# Patient Record
Sex: Female | Born: 1970 | Race: White | Hispanic: No | Marital: Married | Smoking: Never smoker
Health system: Southern US, Community
[De-identification: ages and names within clinical notes are randomized; demographics above are authoritative.]

## PROBLEM LIST (undated history)

## (undated) DIAGNOSIS — M1990 Unspecified osteoarthritis, unspecified site: Secondary | ICD-10-CM

## (undated) DIAGNOSIS — F329 Major depressive disorder, single episode, unspecified: Secondary | ICD-10-CM

## (undated) DIAGNOSIS — R519 Headache, unspecified: Secondary | ICD-10-CM

## (undated) DIAGNOSIS — K219 Gastro-esophageal reflux disease without esophagitis: Secondary | ICD-10-CM

## (undated) DIAGNOSIS — D649 Anemia, unspecified: Secondary | ICD-10-CM

## (undated) DIAGNOSIS — C801 Malignant (primary) neoplasm, unspecified: Secondary | ICD-10-CM

## (undated) DIAGNOSIS — T4145XA Adverse effect of unspecified anesthetic, initial encounter: Secondary | ICD-10-CM

## (undated) DIAGNOSIS — T8859XA Other complications of anesthesia, initial encounter: Secondary | ICD-10-CM

## (undated) DIAGNOSIS — F4310 Post-traumatic stress disorder, unspecified: Secondary | ICD-10-CM

## (undated) DIAGNOSIS — F419 Anxiety disorder, unspecified: Secondary | ICD-10-CM

## (undated) DIAGNOSIS — I10 Essential (primary) hypertension: Secondary | ICD-10-CM

## (undated) DIAGNOSIS — F32A Depression, unspecified: Secondary | ICD-10-CM

## (undated) DIAGNOSIS — E785 Hyperlipidemia, unspecified: Secondary | ICD-10-CM

## (undated) DIAGNOSIS — J189 Pneumonia, unspecified organism: Secondary | ICD-10-CM

## (undated) DIAGNOSIS — R748 Abnormal levels of other serum enzymes: Secondary | ICD-10-CM

## (undated) DIAGNOSIS — J45909 Unspecified asthma, uncomplicated: Secondary | ICD-10-CM

## (undated) HISTORY — PX: EXPLORATORY LAPAROTOMY: SUR591

## (undated) HISTORY — PX: BREAST SURGERY: SHX581

## (undated) HISTORY — PX: ABDOMINAL HYSTERECTOMY: SHX81

## (undated) HISTORY — DX: Hyperlipidemia, unspecified: E78.5

## (undated) HISTORY — PX: ARTHROSCOPIC REPAIR ACL: SUR80

## (undated) HISTORY — PX: HARDWARE REMOVAL: SHX979

## (undated) HISTORY — PX: WISDOM TOOTH EXTRACTION: SHX21

## (undated) HISTORY — PX: TONSILLECTOMY: SUR1361

## (undated) HISTORY — PX: FRACTURE SURGERY: SHX138

## (undated) HISTORY — PX: OSTEOTOMY AND TIBIAL SHORTENING: SHX2139

## (undated) HISTORY — PX: CHOLECYSTECTOMY: SHX55

---
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
## 2008-12-19 HISTORY — PX: GASTRIC ROUX-EN-Y: SHX5262

## 2009-02-17 DIAGNOSIS — Z9884 Bariatric surgery status: Secondary | ICD-10-CM | POA: Insufficient documentation

## 2014-11-04 DIAGNOSIS — D509 Iron deficiency anemia, unspecified: Secondary | ICD-10-CM | POA: Insufficient documentation

## 2016-02-06 DIAGNOSIS — M1711 Unilateral primary osteoarthritis, right knee: Secondary | ICD-10-CM | POA: Diagnosis present

## 2016-03-16 DIAGNOSIS — M25861 Other specified joint disorders, right knee: Secondary | ICD-10-CM | POA: Insufficient documentation

## 2016-07-27 DIAGNOSIS — I10 Essential (primary) hypertension: Secondary | ICD-10-CM | POA: Insufficient documentation

## 2016-07-27 DIAGNOSIS — J4520 Mild intermittent asthma, uncomplicated: Secondary | ICD-10-CM | POA: Insufficient documentation

## 2016-09-24 DIAGNOSIS — R519 Headache, unspecified: Secondary | ICD-10-CM | POA: Insufficient documentation

## 2016-09-24 DIAGNOSIS — F902 Attention-deficit hyperactivity disorder, combined type: Secondary | ICD-10-CM | POA: Insufficient documentation

## 2016-09-24 DIAGNOSIS — G8929 Other chronic pain: Secondary | ICD-10-CM | POA: Insufficient documentation

## 2016-09-24 DIAGNOSIS — F4310 Post-traumatic stress disorder, unspecified: Secondary | ICD-10-CM | POA: Insufficient documentation

## 2016-09-24 DIAGNOSIS — K219 Gastro-esophageal reflux disease without esophagitis: Secondary | ICD-10-CM | POA: Insufficient documentation

## 2016-09-24 DIAGNOSIS — F419 Anxiety disorder, unspecified: Secondary | ICD-10-CM | POA: Insufficient documentation

## 2016-10-23 DIAGNOSIS — F319 Bipolar disorder, unspecified: Secondary | ICD-10-CM | POA: Insufficient documentation

## 2016-10-23 DIAGNOSIS — F913 Oppositional defiant disorder: Secondary | ICD-10-CM | POA: Insufficient documentation

## 2016-11-30 DIAGNOSIS — G4700 Insomnia, unspecified: Secondary | ICD-10-CM | POA: Insufficient documentation

## 2017-01-28 DIAGNOSIS — R110 Nausea: Secondary | ICD-10-CM | POA: Insufficient documentation

## 2017-10-12 DIAGNOSIS — R000 Tachycardia, unspecified: Secondary | ICD-10-CM | POA: Insufficient documentation

## 2017-10-18 DIAGNOSIS — R002 Palpitations: Secondary | ICD-10-CM | POA: Insufficient documentation

## 2018-06-01 DIAGNOSIS — R928 Other abnormal and inconclusive findings on diagnostic imaging of breast: Secondary | ICD-10-CM | POA: Insufficient documentation

## 2018-09-19 DIAGNOSIS — M79604 Pain in right leg: Secondary | ICD-10-CM | POA: Diagnosis not present

## 2018-09-29 DIAGNOSIS — J45909 Unspecified asthma, uncomplicated: Secondary | ICD-10-CM | POA: Diagnosis not present

## 2018-09-29 DIAGNOSIS — T8484XA Pain due to internal orthopedic prosthetic devices, implants and grafts, initial encounter: Secondary | ICD-10-CM | POA: Diagnosis not present

## 2018-09-29 DIAGNOSIS — Z880 Allergy status to penicillin: Secondary | ICD-10-CM | POA: Diagnosis not present

## 2018-09-29 DIAGNOSIS — F419 Anxiety disorder, unspecified: Secondary | ICD-10-CM | POA: Diagnosis not present

## 2018-09-29 DIAGNOSIS — Y838 Other surgical procedures as the cause of abnormal reaction of the patient, or of later complication, without mention of misadventure at the time of the procedure: Secondary | ICD-10-CM | POA: Diagnosis not present

## 2018-09-29 DIAGNOSIS — Z96698 Presence of other orthopedic joint implants: Secondary | ICD-10-CM | POA: Diagnosis not present

## 2018-09-29 DIAGNOSIS — H9201 Otalgia, right ear: Secondary | ICD-10-CM | POA: Diagnosis not present

## 2018-09-29 DIAGNOSIS — T847XXA Infection and inflammatory reaction due to other internal orthopedic prosthetic devices, implants and grafts, initial encounter: Secondary | ICD-10-CM | POA: Diagnosis not present

## 2018-09-29 DIAGNOSIS — I10 Essential (primary) hypertension: Secondary | ICD-10-CM | POA: Diagnosis not present

## 2018-09-29 DIAGNOSIS — Z452 Encounter for adjustment and management of vascular access device: Secondary | ICD-10-CM | POA: Diagnosis not present

## 2018-09-29 DIAGNOSIS — Z79899 Other long term (current) drug therapy: Secondary | ICD-10-CM | POA: Diagnosis not present

## 2018-09-30 DIAGNOSIS — T847XXA Infection and inflammatory reaction due to other internal orthopedic prosthetic devices, implants and grafts, initial encounter: Secondary | ICD-10-CM | POA: Diagnosis not present

## 2018-09-30 DIAGNOSIS — F419 Anxiety disorder, unspecified: Secondary | ICD-10-CM | POA: Diagnosis not present

## 2018-09-30 DIAGNOSIS — Y838 Other surgical procedures as the cause of abnormal reaction of the patient, or of later complication, without mention of misadventure at the time of the procedure: Secondary | ICD-10-CM | POA: Diagnosis not present

## 2018-09-30 DIAGNOSIS — I10 Essential (primary) hypertension: Secondary | ICD-10-CM | POA: Diagnosis not present

## 2018-10-01 DIAGNOSIS — I10 Essential (primary) hypertension: Secondary | ICD-10-CM | POA: Diagnosis not present

## 2018-10-01 DIAGNOSIS — F419 Anxiety disorder, unspecified: Secondary | ICD-10-CM | POA: Diagnosis not present

## 2018-10-01 DIAGNOSIS — T847XXA Infection and inflammatory reaction due to other internal orthopedic prosthetic devices, implants and grafts, initial encounter: Secondary | ICD-10-CM | POA: Diagnosis not present

## 2018-10-01 DIAGNOSIS — Y838 Other surgical procedures as the cause of abnormal reaction of the patient, or of later complication, without mention of misadventure at the time of the procedure: Secondary | ICD-10-CM | POA: Diagnosis not present

## 2018-10-02 DIAGNOSIS — F419 Anxiety disorder, unspecified: Secondary | ICD-10-CM | POA: Diagnosis not present

## 2018-10-02 DIAGNOSIS — I10 Essential (primary) hypertension: Secondary | ICD-10-CM | POA: Diagnosis not present

## 2018-10-02 DIAGNOSIS — Y838 Other surgical procedures as the cause of abnormal reaction of the patient, or of later complication, without mention of misadventure at the time of the procedure: Secondary | ICD-10-CM | POA: Diagnosis not present

## 2018-10-02 DIAGNOSIS — T847XXA Infection and inflammatory reaction due to other internal orthopedic prosthetic devices, implants and grafts, initial encounter: Secondary | ICD-10-CM | POA: Diagnosis not present

## 2018-10-03 DIAGNOSIS — I10 Essential (primary) hypertension: Secondary | ICD-10-CM | POA: Diagnosis not present

## 2018-10-03 DIAGNOSIS — T847XXA Infection and inflammatory reaction due to other internal orthopedic prosthetic devices, implants and grafts, initial encounter: Secondary | ICD-10-CM | POA: Diagnosis not present

## 2018-10-03 DIAGNOSIS — Y838 Other surgical procedures as the cause of abnormal reaction of the patient, or of later complication, without mention of misadventure at the time of the procedure: Secondary | ICD-10-CM | POA: Diagnosis not present

## 2018-10-03 DIAGNOSIS — F419 Anxiety disorder, unspecified: Secondary | ICD-10-CM | POA: Diagnosis not present

## 2018-10-11 DIAGNOSIS — T847XXA Infection and inflammatory reaction due to other internal orthopedic prosthetic devices, implants and grafts, initial encounter: Secondary | ICD-10-CM | POA: Diagnosis not present

## 2018-10-31 DIAGNOSIS — R197 Diarrhea, unspecified: Secondary | ICD-10-CM | POA: Diagnosis not present

## 2018-10-31 DIAGNOSIS — H6501 Acute serous otitis media, right ear: Secondary | ICD-10-CM | POA: Diagnosis not present

## 2018-10-31 DIAGNOSIS — R52 Pain, unspecified: Secondary | ICD-10-CM | POA: Diagnosis not present

## 2018-11-08 DIAGNOSIS — G43909 Migraine, unspecified, not intractable, without status migrainosus: Secondary | ICD-10-CM | POA: Diagnosis not present

## 2018-11-08 DIAGNOSIS — R52 Pain, unspecified: Secondary | ICD-10-CM | POA: Diagnosis not present

## 2018-11-08 DIAGNOSIS — G4701 Insomnia due to medical condition: Secondary | ICD-10-CM | POA: Diagnosis not present

## 2018-11-08 DIAGNOSIS — R5383 Other fatigue: Secondary | ICD-10-CM | POA: Diagnosis not present

## 2018-11-08 DIAGNOSIS — E039 Hypothyroidism, unspecified: Secondary | ICD-10-CM | POA: Diagnosis not present

## 2018-11-08 DIAGNOSIS — E119 Type 2 diabetes mellitus without complications: Secondary | ICD-10-CM | POA: Diagnosis not present

## 2018-11-08 DIAGNOSIS — E7800 Pure hypercholesterolemia, unspecified: Secondary | ICD-10-CM | POA: Diagnosis not present

## 2018-11-09 DIAGNOSIS — M79604 Pain in right leg: Secondary | ICD-10-CM | POA: Diagnosis not present

## 2018-11-09 DIAGNOSIS — M6281 Muscle weakness (generalized): Secondary | ICD-10-CM | POA: Diagnosis not present

## 2018-11-09 DIAGNOSIS — M25651 Stiffness of right hip, not elsewhere classified: Secondary | ICD-10-CM | POA: Diagnosis not present

## 2018-11-20 DIAGNOSIS — Z760 Encounter for issue of repeat prescription: Secondary | ICD-10-CM | POA: Diagnosis not present

## 2018-11-20 DIAGNOSIS — G4701 Insomnia due to medical condition: Secondary | ICD-10-CM | POA: Diagnosis not present

## 2018-11-29 DIAGNOSIS — M26629 Arthralgia of temporomandibular joint, unspecified side: Secondary | ICD-10-CM | POA: Diagnosis not present

## 2018-11-29 DIAGNOSIS — H9201 Otalgia, right ear: Secondary | ICD-10-CM | POA: Diagnosis not present

## 2018-11-29 DIAGNOSIS — R252 Cramp and spasm: Secondary | ICD-10-CM | POA: Diagnosis not present

## 2018-11-29 DIAGNOSIS — J342 Deviated nasal septum: Secondary | ICD-10-CM | POA: Diagnosis not present

## 2019-02-13 DIAGNOSIS — R609 Edema, unspecified: Secondary | ICD-10-CM | POA: Diagnosis not present

## 2019-02-13 DIAGNOSIS — E039 Hypothyroidism, unspecified: Secondary | ICD-10-CM | POA: Diagnosis not present

## 2019-02-13 DIAGNOSIS — E119 Type 2 diabetes mellitus without complications: Secondary | ICD-10-CM | POA: Diagnosis not present

## 2019-02-13 DIAGNOSIS — Z0000 Encounter for general adult medical examination without abnormal findings: Secondary | ICD-10-CM | POA: Diagnosis not present

## 2019-02-13 DIAGNOSIS — G4700 Insomnia, unspecified: Secondary | ICD-10-CM | POA: Diagnosis not present

## 2019-02-13 DIAGNOSIS — E7800 Pure hypercholesterolemia, unspecified: Secondary | ICD-10-CM | POA: Diagnosis not present

## 2019-02-13 DIAGNOSIS — Z139 Encounter for screening, unspecified: Secondary | ICD-10-CM | POA: Diagnosis not present

## 2019-05-03 DIAGNOSIS — I10 Essential (primary) hypertension: Secondary | ICD-10-CM | POA: Diagnosis not present

## 2019-05-08 DIAGNOSIS — I10 Essential (primary) hypertension: Secondary | ICD-10-CM | POA: Diagnosis not present

## 2019-05-08 DIAGNOSIS — F5104 Psychophysiologic insomnia: Secondary | ICD-10-CM | POA: Diagnosis not present

## 2019-05-08 DIAGNOSIS — R748 Abnormal levels of other serum enzymes: Secondary | ICD-10-CM | POA: Diagnosis not present

## 2019-05-08 LAB — HM HEPATITIS C SCREENING LAB: HM Hepatitis Screen: NEGATIVE

## 2019-05-10 DIAGNOSIS — R52 Pain, unspecified: Secondary | ICD-10-CM | POA: Diagnosis not present

## 2019-05-10 DIAGNOSIS — M797 Fibromyalgia: Secondary | ICD-10-CM | POA: Diagnosis not present

## 2019-05-17 DIAGNOSIS — G4700 Insomnia, unspecified: Secondary | ICD-10-CM | POA: Diagnosis not present

## 2019-05-17 DIAGNOSIS — Z1331 Encounter for screening for depression: Secondary | ICD-10-CM | POA: Diagnosis not present

## 2019-05-17 DIAGNOSIS — R5383 Other fatigue: Secondary | ICD-10-CM | POA: Diagnosis not present

## 2019-05-17 DIAGNOSIS — R748 Abnormal levels of other serum enzymes: Secondary | ICD-10-CM | POA: Diagnosis not present

## 2019-05-17 DIAGNOSIS — R002 Palpitations: Secondary | ICD-10-CM | POA: Diagnosis not present

## 2019-05-17 DIAGNOSIS — Z9884 Bariatric surgery status: Secondary | ICD-10-CM | POA: Diagnosis not present

## 2019-05-21 DIAGNOSIS — Z9884 Bariatric surgery status: Secondary | ICD-10-CM | POA: Diagnosis not present

## 2019-05-21 DIAGNOSIS — W133XXA Fall through floor, initial encounter: Secondary | ICD-10-CM | POA: Diagnosis not present

## 2019-05-21 DIAGNOSIS — S82832A Other fracture of upper and lower end of left fibula, initial encounter for closed fracture: Secondary | ICD-10-CM | POA: Diagnosis not present

## 2019-05-21 DIAGNOSIS — I10 Essential (primary) hypertension: Secondary | ICD-10-CM | POA: Diagnosis not present

## 2019-05-21 DIAGNOSIS — S82432A Displaced oblique fracture of shaft of left fibula, initial encounter for closed fracture: Secondary | ICD-10-CM | POA: Diagnosis not present

## 2019-05-21 DIAGNOSIS — Z6837 Body mass index (BMI) 37.0-37.9, adult: Secondary | ICD-10-CM | POA: Diagnosis not present

## 2019-05-23 DIAGNOSIS — S82832A Other fracture of upper and lower end of left fibula, initial encounter for closed fracture: Secondary | ICD-10-CM | POA: Diagnosis not present

## 2019-05-28 DIAGNOSIS — M79662 Pain in left lower leg: Secondary | ICD-10-CM | POA: Diagnosis not present

## 2019-05-28 DIAGNOSIS — R600 Localized edema: Secondary | ICD-10-CM | POA: Diagnosis not present

## 2019-06-06 DIAGNOSIS — S82832A Other fracture of upper and lower end of left fibula, initial encounter for closed fracture: Secondary | ICD-10-CM | POA: Diagnosis not present

## 2019-06-19 DIAGNOSIS — I10 Essential (primary) hypertension: Secondary | ICD-10-CM | POA: Diagnosis not present

## 2019-06-19 DIAGNOSIS — R002 Palpitations: Secondary | ICD-10-CM | POA: Diagnosis not present

## 2019-06-19 DIAGNOSIS — G4700 Insomnia, unspecified: Secondary | ICD-10-CM | POA: Diagnosis not present

## 2019-06-19 DIAGNOSIS — R748 Abnormal levels of other serum enzymes: Secondary | ICD-10-CM | POA: Diagnosis not present

## 2019-07-09 DIAGNOSIS — S82832A Other fracture of upper and lower end of left fibula, initial encounter for closed fracture: Secondary | ICD-10-CM | POA: Diagnosis not present

## 2019-08-20 DIAGNOSIS — S82832A Other fracture of upper and lower end of left fibula, initial encounter for closed fracture: Secondary | ICD-10-CM | POA: Diagnosis not present

## 2019-08-23 DIAGNOSIS — Z1231 Encounter for screening mammogram for malignant neoplasm of breast: Secondary | ICD-10-CM | POA: Diagnosis not present

## 2019-09-17 DIAGNOSIS — M25561 Pain in right knee: Secondary | ICD-10-CM | POA: Diagnosis not present

## 2019-09-20 DIAGNOSIS — M25561 Pain in right knee: Secondary | ICD-10-CM | POA: Diagnosis not present

## 2019-09-21 DIAGNOSIS — M25561 Pain in right knee: Secondary | ICD-10-CM | POA: Diagnosis not present

## 2019-10-01 DIAGNOSIS — M25561 Pain in right knee: Secondary | ICD-10-CM | POA: Diagnosis not present

## 2019-10-11 ENCOUNTER — Other Ambulatory Visit: Payer: Self-pay

## 2019-10-11 ENCOUNTER — Encounter (HOSPITAL_BASED_OUTPATIENT_CLINIC_OR_DEPARTMENT_OTHER): Payer: Self-pay | Admitting: *Deleted
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
## 2019-10-15 ENCOUNTER — Encounter (HOSPITAL_BASED_OUTPATIENT_CLINIC_OR_DEPARTMENT_OTHER)
Admission: RE | Admit: 2019-10-15 | Discharge: 2019-10-15 | Disposition: A | Discharge status: Home / Self Care | Payer: BC Managed Care – PPO | Source: Ambulatory Visit | Attending: Orthopaedic Surgery | Admitting: Orthopaedic Surgery

## 2019-10-15 ENCOUNTER — Other Ambulatory Visit (HOSPITAL_COMMUNITY)
Admission: RE | Admit: 2019-10-15 | Discharge: 2019-10-15 | Disposition: A | Discharge status: Home / Self Care | Payer: BC Managed Care – PPO | Source: Ambulatory Visit | Attending: Orthopaedic Surgery | Admitting: Orthopaedic Surgery

## 2019-10-15 DIAGNOSIS — Z20828 Contact with and (suspected) exposure to other viral communicable diseases: Secondary | ICD-10-CM | POA: Insufficient documentation

## 2019-10-15 DIAGNOSIS — Z01812 Encounter for preprocedural laboratory examination: Secondary | ICD-10-CM | POA: Diagnosis not present

## 2019-10-15 NOTE — Progress Notes (Signed)
      Enhanced Recovery after Surgery for Orthopedics Enhanced Recovery after Surgery is a protocol used to improve the stress on your body and your recovery after surgery.  Patient Instructions  . The night before surgery:  o No food after midnight. ONLY clear liquids after midnight  . The day of surgery (if you do NOT have diabetes):  o Drink ONE (1) Pre-Surgery Clear Ensure as directed.   o This drink was given to you during your hospital  pre-op appointment visit. o The pre-op nurse will instruct you on the time to drink the  Pre-Surgery Ensure depending on your surgery time. o Finish the drink at the designated time by the pre-op nurse.  o Nothing else to drink after completing the  Pre-Surgery Clear Ensure.  . The day of surgery (if you have diabetes): o Drink ONE (1) Gatorade 2 (G2) as directed. o This drink was given to you during your hospital  pre-op appointment visit.  o The pre-op nurse will instruct you on the time to drink the   Gatorade 2 (G2) depending on your surgery time. o Color of the Gatorade may vary. Red is not allowed. o Nothing else to drink after completing the  Gatorade 2 (G2).         If you have questions, please contact your surgeon's office.  Surgical soap also given to patient with instructions for use.  Patient verbalized understanding of instructions. 

## 2019-10-16 LAB — NOVEL CORONAVIRUS, NAA (HOSP ORDER, SEND-OUT TO REF LAB; TAT 18-24 HRS): SARS-CoV-2, NAA: NOT DETECTED

## 2019-10-18 ENCOUNTER — Encounter (HOSPITAL_BASED_OUTPATIENT_CLINIC_OR_DEPARTMENT_OTHER)
Admission: RE | Disposition: A | Discharge status: Home / Self Care | Payer: Self-pay | Source: Home / Self Care | Attending: Orthopaedic Surgery

## 2019-10-18 ENCOUNTER — Encounter (HOSPITAL_BASED_OUTPATIENT_CLINIC_OR_DEPARTMENT_OTHER): Payer: Self-pay

## 2019-10-18 ENCOUNTER — Ambulatory Visit (HOSPITAL_BASED_OUTPATIENT_CLINIC_OR_DEPARTMENT_OTHER)
Admission: RE | Admit: 2019-10-18 | Discharge: 2019-10-18 | Disposition: A | Discharge status: Home / Self Care | Payer: BC Managed Care – PPO | Attending: Orthopaedic Surgery | Admitting: Orthopaedic Surgery

## 2019-10-18 ENCOUNTER — Other Ambulatory Visit: Payer: Self-pay

## 2019-10-18 ENCOUNTER — Ambulatory Visit (HOSPITAL_BASED_OUTPATIENT_CLINIC_OR_DEPARTMENT_OTHER): Payer: BC Managed Care – PPO | Admitting: Anesthesiology

## 2019-10-18 DIAGNOSIS — Z8541 Personal history of malignant neoplasm of cervix uteri: Secondary | ICD-10-CM | POA: Diagnosis not present

## 2019-10-18 DIAGNOSIS — Z9884 Bariatric surgery status: Secondary | ICD-10-CM | POA: Insufficient documentation

## 2019-10-18 DIAGNOSIS — Z79899 Other long term (current) drug therapy: Secondary | ICD-10-CM | POA: Diagnosis not present

## 2019-10-18 DIAGNOSIS — M1990 Unspecified osteoarthritis, unspecified site: Secondary | ICD-10-CM | POA: Diagnosis not present

## 2019-10-18 DIAGNOSIS — X58XXXA Exposure to other specified factors, initial encounter: Secondary | ICD-10-CM | POA: Diagnosis not present

## 2019-10-18 DIAGNOSIS — J45909 Unspecified asthma, uncomplicated: Secondary | ICD-10-CM | POA: Insufficient documentation

## 2019-10-18 DIAGNOSIS — K219 Gastro-esophageal reflux disease without esophagitis: Secondary | ICD-10-CM | POA: Insufficient documentation

## 2019-10-18 DIAGNOSIS — S83261A Peripheral tear of lateral meniscus, current injury, right knee, initial encounter: Secondary | ICD-10-CM | POA: Diagnosis not present

## 2019-10-18 DIAGNOSIS — Z85819 Personal history of malignant neoplasm of unspecified site of lip, oral cavity, and pharynx: Secondary | ICD-10-CM | POA: Diagnosis not present

## 2019-10-18 DIAGNOSIS — F419 Anxiety disorder, unspecified: Secondary | ICD-10-CM | POA: Insufficient documentation

## 2019-10-18 DIAGNOSIS — S83281A Other tear of lateral meniscus, current injury, right knee, initial encounter: Secondary | ICD-10-CM | POA: Diagnosis not present

## 2019-10-18 DIAGNOSIS — I10 Essential (primary) hypertension: Secondary | ICD-10-CM | POA: Diagnosis not present

## 2019-10-18 DIAGNOSIS — M94261 Chondromalacia, right knee: Secondary | ICD-10-CM | POA: Insufficient documentation

## 2019-10-18 DIAGNOSIS — Z880 Allergy status to penicillin: Secondary | ICD-10-CM | POA: Diagnosis not present

## 2019-10-18 DIAGNOSIS — Z886 Allergy status to analgesic agent status: Secondary | ICD-10-CM | POA: Insufficient documentation

## 2019-10-18 DIAGNOSIS — M2241 Chondromalacia patellae, right knee: Secondary | ICD-10-CM | POA: Diagnosis not present

## 2019-10-18 HISTORY — DX: Other complications of anesthesia, initial encounter: T88.59XA

## 2019-10-18 HISTORY — DX: Anxiety disorder, unspecified: F41.9

## 2019-10-18 HISTORY — PX: KNEE ARTHROSCOPY WITH LATERAL MENISECTOMY: SHX6193

## 2019-10-18 HISTORY — PX: CHONDROPLASTY: SHX5177

## 2019-10-18 HISTORY — DX: Malignant (primary) neoplasm, unspecified: C80.1

## 2019-10-18 HISTORY — DX: Headache, unspecified: R51.9

## 2019-10-18 HISTORY — DX: Unspecified asthma, uncomplicated: J45.909

## 2019-10-18 HISTORY — DX: Anemia, unspecified: D64.9

## 2019-10-18 HISTORY — DX: Depression, unspecified: F32.A

## 2019-10-18 HISTORY — DX: Gastro-esophageal reflux disease without esophagitis: K21.9

## 2019-10-18 HISTORY — DX: Unspecified osteoarthritis, unspecified site: M19.90

## 2019-10-18 HISTORY — DX: Post-traumatic stress disorder, unspecified: F43.10

## 2019-10-18 HISTORY — DX: Essential (primary) hypertension: I10

## 2019-10-18 SURGERY — CHONDROPLASTY
Anesthesia: General | Site: Knee | Laterality: Right

## 2019-10-18 MED ORDER — FENTANYL CITRATE (PF) 100 MCG/2ML IJ SOLN: 25.0000 ug | INTRAMUSCULAR | Status: DC | PRN

## 2019-10-18 MED ORDER — MIDAZOLAM HCL 2 MG/2ML IJ SOLN: 1.0000 mg | INTRAMUSCULAR | Status: DC | PRN

## 2019-10-18 MED ORDER — OXYCODONE HCL 5 MG/5ML PO SOLN: 5.0000 mg | Freq: Four times a day (QID) | ORAL | 0 refills | Status: DC | PRN

## 2019-10-18 MED ORDER — OXYCODONE HCL 5 MG/5ML PO SOLN: 5.0000 mg | Freq: Once | ORAL | Status: DC | PRN

## 2019-10-18 MED ORDER — HYDROMORPHONE HCL 1 MG/ML IJ SOLN
0.2500 mg | INTRAMUSCULAR | Status: DC | PRN
  Administered 2019-10-18 (×2): 0.5 mg via INTRAVENOUS

## 2019-10-18 MED ORDER — ACETAMINOPHEN 160 MG/5ML PO SOLN: 325.0000 mg | ORAL | Status: DC | PRN

## 2019-10-18 MED ORDER — CHLORHEXIDINE GLUCONATE 4 % EX LIQD: 60.0000 mL | Freq: Once | CUTANEOUS | Status: DC

## 2019-10-18 MED ORDER — BUPIVACAINE HCL (PF) 0.25 % IJ SOLN
INTRAMUSCULAR | Status: DC | PRN
  Administered 2019-10-18: 10 mL

## 2019-10-18 MED ORDER — DEXAMETHASONE SODIUM PHOSPHATE 10 MG/ML IJ SOLN
INTRAMUSCULAR | Status: AC
  Filled 2019-10-18: qty 1

## 2019-10-18 MED ORDER — HYDROMORPHONE HCL 1 MG/ML IJ SOLN
INTRAMUSCULAR | Status: AC
  Filled 2019-10-18: qty 0.5

## 2019-10-18 MED ORDER — PROPOFOL 10 MG/ML IV BOLUS
INTRAVENOUS | Status: AC
  Filled 2019-10-18: qty 20

## 2019-10-18 MED ORDER — LIDOCAINE HCL (CARDIAC) PF 100 MG/5ML IV SOSY
PREFILLED_SYRINGE | INTRAVENOUS | Status: DC | PRN
  Administered 2019-10-18: 100 mg via INTRAVENOUS

## 2019-10-18 MED ORDER — ONDANSETRON HCL 4 MG/2ML IJ SOLN: 4.0000 mg | Freq: Once | INTRAMUSCULAR | Status: DC | PRN

## 2019-10-18 MED ORDER — DEXMEDETOMIDINE HCL IN NACL 200 MCG/50ML IV SOLN
INTRAVENOUS | Status: DC | PRN
  Administered 2019-10-18 (×6): 4 ug via INTRAVENOUS
  Administered 2019-10-18: 8 ug via INTRAVENOUS
  Administered 2019-10-18: 4 ug via INTRAVENOUS

## 2019-10-18 MED ORDER — MEPERIDINE HCL 25 MG/ML IJ SOLN: 6.2500 mg | INTRAMUSCULAR | Status: DC | PRN

## 2019-10-18 MED ORDER — OXYCODONE HCL 5 MG PO TABS: 5.0000 mg | ORAL_TABLET | Freq: Once | ORAL | Status: DC | PRN

## 2019-10-18 MED ORDER — ACETAMINOPHEN 500 MG PO TABS: 1000.0000 mg | ORAL_TABLET | Freq: Three times a day (TID) | ORAL | 0 refills | Status: AC

## 2019-10-18 MED ORDER — FENTANYL CITRATE (PF) 100 MCG/2ML IJ SOLN
INTRAMUSCULAR | Status: DC | PRN
  Administered 2019-10-18 (×4): 50 ug via INTRAVENOUS

## 2019-10-18 MED ORDER — CLINDAMYCIN PHOSPHATE 900 MG/50ML IV SOLN
INTRAVENOUS | Status: AC
  Filled 2019-10-18: qty 50

## 2019-10-18 MED ORDER — CLINDAMYCIN PHOSPHATE 900 MG/50ML IV SOLN
900.0000 mg | INTRAVENOUS | Status: AC
  Administered 2019-10-18: 09:00:00 900 mg via INTRAVENOUS

## 2019-10-18 MED ORDER — DIPHENHYDRAMINE HCL 50 MG/ML IJ SOLN
25.0000 mg | Freq: Once | INTRAMUSCULAR | Status: AC
  Administered 2019-10-18: 25 mg via INTRAVENOUS

## 2019-10-18 MED ORDER — LIDOCAINE 2% (20 MG/ML) 5 ML SYRINGE
INTRAMUSCULAR | Status: AC
  Filled 2019-10-18: qty 5

## 2019-10-18 MED ORDER — ACETAMINOPHEN 325 MG PO TABS: 325.0000 mg | ORAL_TABLET | ORAL | Status: DC | PRN

## 2019-10-18 MED ORDER — FENTANYL CITRATE (PF) 100 MCG/2ML IJ SOLN
INTRAMUSCULAR | Status: AC
  Filled 2019-10-18: qty 2

## 2019-10-18 MED ORDER — RIVAROXABAN 10 MG PO TABS: 10.0000 mg | ORAL_TABLET | Freq: Every day | ORAL | 0 refills | Status: DC

## 2019-10-18 MED ORDER — ONDANSETRON HCL 4 MG PO TABS: 4.0000 mg | ORAL_TABLET | Freq: Three times a day (TID) | ORAL | 1 refills | Status: AC | PRN

## 2019-10-18 MED ORDER — DEXAMETHASONE SODIUM PHOSPHATE 4 MG/ML IJ SOLN
INTRAMUSCULAR | Status: DC | PRN
  Administered 2019-10-18: 10 mg via INTRAVENOUS

## 2019-10-18 MED ORDER — VANCOMYCIN HCL 1000 MG IV SOLR
INTRAVENOUS | Status: AC
  Filled 2019-10-18: qty 1000

## 2019-10-18 MED ORDER — MIDAZOLAM HCL 2 MG/2ML IJ SOLN
2.0000 mg | Freq: Once | INTRAMUSCULAR | Status: AC
  Administered 2019-10-18: 2 mg via INTRAVENOUS

## 2019-10-18 MED ORDER — PROPOFOL 10 MG/ML IV BOLUS
INTRAVENOUS | Status: DC | PRN
  Administered 2019-10-18: 200 mg via INTRAVENOUS

## 2019-10-18 MED ORDER — MIDAZOLAM HCL 2 MG/2ML IJ SOLN
INTRAMUSCULAR | Status: AC
  Filled 2019-10-18: qty 2

## 2019-10-18 MED ORDER — SODIUM CHLORIDE 0.9 % IR SOLN
Status: DC | PRN
  Administered 2019-10-18: 2000 mL

## 2019-10-18 MED ORDER — ONDANSETRON HCL 4 MG/2ML IJ SOLN
INTRAMUSCULAR | Status: AC
  Filled 2019-10-18: qty 2

## 2019-10-18 MED ORDER — FENTANYL CITRATE (PF) 100 MCG/2ML IJ SOLN: 50.0000 ug | INTRAMUSCULAR | Status: DC | PRN

## 2019-10-18 MED ORDER — LACTATED RINGERS IV SOLN
INTRAVENOUS | Status: DC
  Administered 2019-10-18: 10:00:00 10 mL/h via INTRAVENOUS
  Administered 2019-10-18: 09:00:00 via INTRAVENOUS

## 2019-10-18 MED ORDER — DIPHENHYDRAMINE HCL 50 MG/ML IJ SOLN
INTRAMUSCULAR | Status: AC
  Filled 2019-10-18: qty 1

## 2019-10-18 MED ORDER — ONDANSETRON HCL 4 MG/2ML IJ SOLN
INTRAMUSCULAR | Status: DC | PRN
  Administered 2019-10-18: 4 mg via INTRAVENOUS

## 2019-10-18 SURGICAL SUPPLY — 44 items
BANDAGE ESMARK 6X9 LF (GAUZE/BANDAGES/DRESSINGS) IMPLANT
BENZOIN TINCTURE PRP APPL 2/3 (GAUZE/BANDAGES/DRESSINGS) IMPLANT
BLADE CLIPPER SURG (BLADE) IMPLANT
BNDG ELASTIC 6X5.8 VLCR STR LF (GAUZE/BANDAGES/DRESSINGS) ×2 IMPLANT
BNDG ESMARK 6X9 LF (GAUZE/BANDAGES/DRESSINGS)
CHLORAPREP W/TINT 26 (MISCELLANEOUS) ×2 IMPLANT
CLSR STERI-STRIP ANTIMIC 1/2X4 (GAUZE/BANDAGES/DRESSINGS) ×1 IMPLANT
CUFF TOURN SGL QUICK 34 (TOURNIQUET CUFF) ×1
CUFF TRNQT CYL 34X4.125X (TOURNIQUET CUFF) ×1 IMPLANT
DISSECTOR 3.5MM X 13CM CVD (MISCELLANEOUS) IMPLANT
DISSECTOR 4.0MMX13CM CVD (MISCELLANEOUS) ×1 IMPLANT
DRAPE ARTHROSCOPY W/POUCH 90 (DRAPES) ×2 IMPLANT
DRAPE IMP U-DRAPE 54X76 (DRAPES) ×2 IMPLANT
DRAPE LAPAROTOMY 100X72 PEDS (DRAPES) IMPLANT
DRAPE U-SHAPE 47X51 STRL (DRAPES) ×2 IMPLANT
GAUZE SPONGE 4X4 12PLY STRL (GAUZE/BANDAGES/DRESSINGS) ×2 IMPLANT
GAUZE XEROFORM 1X8 LF (GAUZE/BANDAGES/DRESSINGS) ×1 IMPLANT
GLOVE BIO SURGEON STRL SZ 6.5 (GLOVE) ×2 IMPLANT
GLOVE BIOGEL PI IND STRL 6.5 (GLOVE) ×1 IMPLANT
GLOVE BIOGEL PI IND STRL 7.0 (GLOVE) IMPLANT
GLOVE BIOGEL PI IND STRL 8 (GLOVE) ×1 IMPLANT
GLOVE BIOGEL PI INDICATOR 6.5 (GLOVE) ×1
GLOVE BIOGEL PI INDICATOR 7.0 (GLOVE) ×2
GLOVE BIOGEL PI INDICATOR 8 (GLOVE) ×1
GLOVE ECLIPSE 6.5 STRL STRAW (GLOVE) ×1 IMPLANT
GLOVE ECLIPSE 8.0 STRL XLNG CF (GLOVE) ×4 IMPLANT
GOWN STRL REUS W/ TWL LRG LVL3 (GOWN DISPOSABLE) ×1 IMPLANT
GOWN STRL REUS W/TWL LRG LVL3 (GOWN DISPOSABLE) ×2
GOWN STRL REUS W/TWL XL LVL3 (GOWN DISPOSABLE) ×2 IMPLANT
KIT TURNOVER KIT B (KITS) ×2 IMPLANT
MANIFOLD NEPTUNE II (INSTRUMENTS) ×1 IMPLANT
NDL SAFETY ECLIPSE 18X1.5 (NEEDLE) ×1 IMPLANT
NEEDLE HYPO 18GX1.5 SHARP (NEEDLE) ×1
NS IRRIG 1000ML POUR BTL (IV SOLUTION) IMPLANT
PACK ARTHROSCOPY DSU (CUSTOM PROCEDURE TRAY) ×2 IMPLANT
PORT APPOLLO RF 90DEGREE MULTI (SURGICAL WAND) IMPLANT
SLEEVE SCD COMPRESS KNEE MED (MISCELLANEOUS) ×2 IMPLANT
SUT ETHILON 3 0 PS 1 (SUTURE) ×1 IMPLANT
SUT MNCRL AB 4-0 PS2 18 (SUTURE) ×2 IMPLANT
SYR 5ML LUER SLIP (SYRINGE) ×1 IMPLANT
TOWEL GREEN STERILE FF (TOWEL DISPOSABLE) ×2 IMPLANT
TUBE CONNECTING 20X1/4 (TUBING) ×1 IMPLANT
TUBING ARTHROSCOPY IRRIG 16FT (MISCELLANEOUS) ×2 IMPLANT
WATER STERILE IRR 1000ML POUR (IV SOLUTION) ×1 IMPLANT

## 2019-10-18 NOTE — Anesthesia Postprocedure Evaluation (Signed)
Anesthesia Post Note  Patient: Shirley Cameron  Procedure(s) Performed: CHONDROPLASTY (Right Knee) KNEE ARTHROSCOPY WITH LATERAL MENISECTOMY (Right Knee)     Patient location during evaluation: PACU Anesthesia Type: General Level of consciousness: awake and alert Pain management: pain level controlled Vital Signs Assessment: post-procedure vital signs reviewed and stable Respiratory status: spontaneous breathing, nonlabored ventilation, respiratory function stable and patient connected to nasal cannula oxygen Cardiovascular status: blood pressure returned to baseline and stable Postop Assessment: no apparent nausea or vomiting Anesthetic complications: no    Last Vitals:  Vitals:   10/18/19 1100 10/18/19 1134  BP: 105/68 137/72  Pulse: 74 76  Resp: 12 20  Temp:  36.7 C  SpO2: 98% 98%    Last Pain:  Vitals:   10/18/19 1134  TempSrc: Oral  PainSc: 2                  Blimi Godby

## 2019-10-18 NOTE — Anesthesia Procedure Notes (Signed)
Procedure Name: LMA Insertion Date/Time: 10/18/2019 9:28 AM Performed by: [place] Paisley Grajeda C, CRNA Pre-anesthesia Checklist: Patient identified, Emergency Drugs available, Suction available and Patient being monitored Patient Re-evaluated:Patient Re-evaluated prior to induction Oxygen Delivery Method: Circle system utilized Preoxygenation: Pre-oxygenation with 100% oxygen Induction Type: IV induction Ventilation: Mask ventilation without difficulty LMA: LMA inserted LMA Size: 4.0 Number of attempts: 1 Airway Equipment and Method: Bite block Placement Confirmation: positive ETCO2 Tube secured with: Tape Dental Injury: Teeth and Oropharynx as per pre-operative assessment

## 2019-10-18 NOTE — Op Note (Signed)
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  The patella had grade 3 changes scattered throughout mostly in the distal aspect. Medial Compartment: 3 x 4 cm full thickness chondral lesion of the medial femoral condyle at about 60 degrees of flexion exposed debrided back to a stable base. Lateral Compartment: Grade 4 changes throughout the entire tibial plateau with preserved cartilage underneath the remnant meniscus alone.  There was grade 3 changes of the majority of the femoral condyle and clear thinning.  This was all debrided back to stable base and a peripheral lateral meniscus tear was noted which was debrided back to a stable base with a shaver. Intercondylar Notch: Normal  Successful completion of the planned procedure.  Patient significant anxiety before surgery however she was able to undergo the procedure.  Her procedure was routine.  If she fails this she would be a good candidate for total knee arthroplasty at this point.  Post-operative plan: The patient will be weightbearing as tolerated.  The patient will be discharged home.  DVT prophylaxis Xarelto 10 mg a day for 30 days due to history of cancer.   Pain control with PRN pain medication preferring oral medicines.  Follow up plan will be scheduled in approximately 7 days for incision check and likely removal as she was closed with nylon.  Post-Op Diagnosis: Same Surgeons:Primary: Hiram Gash, MD Assistants: Noemi Chapel PA-C Location: [facility] OR ROOM 6 Anesthesia: General with local Antibiotics: Clindamycin Tourniquet time: None Estimated Blood Loss: Minimal Complications: None Specimens: None Implants: * No implants in log *  Indications for Surgery:   Shirley Cameron is a 48 y.o. female with long surgical history including varus producing proximal tibial osteotomy and arthroscopy of joint with continued pain.  She eventually had a of infection of her plate and was removed about a year ago.  Pain obtained in mechanical symptoms refractory to nonoperative measures.  She had MRI findings of a partial lateral meniscus tear and diffuse chondral changes however her mechanical symptoms were primary complaint and she wanted to avoid arthroplasty options at this point.  We discussed that an arthroscopy would not necessarily leave her with a pain-free joint but may provide her a chance to extend her recovery until she needs arthroplasty.  Our primary goal would be to address the mechanical type symptoms she had.  She was happy with this plan would like to proceed.  Benefits and risks of operative and nonoperative management were discussed prior to surgery with patient/guardian(s) and informed consent form was completed.  Specific risks including infection, need for additional surgery, stiffness, need for arthroplasty, infection after the surgery.   Procedure:   The patient was identified properly. Informed consent was obtained and the surgical site was marked. The patient was taken up to suite where general anesthesia was induced. The patient was placed in the supine position with a post against the surgical leg and a nonsterile tourniquet applied. The surgical leg was then prepped and draped usual sterile fashion.  A standard surgical timeout was performed.  2 standard anterior portals were made and  diagnostic arthroscopy performed. Please note the findings as noted above.  Tricompartmental chondroplasty  performed of the chondral lesions as described.  Videos were obtained in the [employer] system and saved into our system.  Partial lateral meniscectomy performed with a shaver primarily.  Remainder the joint was clear.  Incisions closed with nonabsorbable suture. The patient was awoken from general anesthesia and taken to the PACU in stable condition without complication.    Noemi Chapel, PA-C, present and scrubbed throughout the case, critical for completion in a timely fashion, and for retraction, instrumentation, closure.

## 2019-10-18 NOTE — Transfer of Care (Signed)
Immediate Anesthesia Transfer of Care Note  Patient: Shirley Cameron  Procedure(s) Performed: CHONDROPLASTY (Right Knee) KNEE ARTHROSCOPY WITH LATERAL MENISECTOMY (Right Knee)  Patient Location: PACU  Anesthesia Type:General  Level of Consciousness: awake, alert  and oriented  Airway & Oxygen Therapy: Patient Spontanous Breathing and Patient connected to nasal cannula oxygen  Post-op Assessment: Report given to RN and Post -op Vital signs reviewed and stable  Post vital signs: Reviewed and stable  Last Vitals:  Vitals Value Taken Time  BP 108/93 10/18/19 1008  Temp    Pulse 72 10/18/19 1017  Resp 16 10/18/19 1017  SpO2 100 % 10/18/19 1017  Vitals shown include unvalidated device data.  Last Pain:  Vitals:   10/18/19 0817  TempSrc: Oral  PainSc: 8       Patients Stated Pain Goal: 5 ([phone])  Complications: No apparent anesthesia complications

## 2019-10-18 NOTE — Discharge Instructions (Signed)

## 2019-10-18 NOTE — Anesthesia Preprocedure Evaluation (Addendum)
Anesthesia Evaluation  Patient identified by MRN, date of birth, ID band Patient awake  General Assessment Comment:Complication of anesthesia  very slow/ hard to wake up and usually has itching   Reviewed: Allergy & Precautions, H&P , NPO status , Patient's Chart, lab work & pertinent test results, reviewed documented beta blocker date and time   History of Anesthesia Complications (+) history of anesthetic complications  Airway Mallampati: II  TM Distance: >3 FB Neck ROM: full  Mouth opening: Limited Mouth Opening  Dental no notable dental hx. (+) Poor Dentition, Chipped, Missing, Dental Advisory Given,    Pulmonary neg pulmonary ROS,    Pulmonary exam normal breath sounds clear to auscultation       Cardiovascular Exercise Tolerance: Good hypertension, Pt. on medications and Pt. on home beta blockers negative cardio ROS   Rhythm:regular Rate:Normal     Neuro/Psych  Headaches, PSYCHIATRIC DISORDERS Anxiety Depression PTSD (post-traumatic stress disorder)   GI/Hepatic Neg liver ROS, GERD  ,  Endo/Other  negative endocrine ROS  Renal/GU negative Renal ROS  negative genitourinary   Musculoskeletal   Abdominal   Peds  Hematology negative hematology ROS (+)   Anesthesia Other Findings   Reproductive/Obstetrics negative OB ROS                            Anesthesia Physical Anesthesia Plan  ASA: III  Anesthesia Plan: General   Post-op Pain Management:    Induction: Intravenous  PONV Risk Score and Plan: 3 and TIVA, Ondansetron and Treatment may vary due to age or medical condition  Airway Management Planned: Oral ETT and LMA  Additional Equipment:   Intra-op Plan:   Post-operative Plan: Extubation in OR  Informed Consent: I have reviewed the patients History and Physical, chart, labs and discussed the procedure including the risks, benefits and alternatives for the proposed  anesthesia with the patient or authorized representative who has indicated his/her understanding and acceptance.     Dental Advisory Given  Plan Discussed with: CRNA, Anesthesiologist and Surgeon  Anesthesia Plan Comments: (  )       Anesthesia Quick Evaluation

## 2019-10-18 NOTE — Interval H&P Note (Signed)
History and Physical Interval Note:  10/18/2019 8:50 AM  Shirley Cameron  has presented today for surgery, with the diagnosis of [place].  The various methods of treatment have been discussed with the patient and family. After consideration of risks, benefits and other options for treatment, the patient has consented to  Procedure(s): CHONDROPLASTY (Right) KNEE ARTHROSCOPY WITH LATERAL MENISECTOMY (Right) as a surgical intervention.  The patient's history has been reviewed, patient examined, no change in status, stable for surgery.  I have reviewed the patient's chart and labs.  Questions were answered to the patient's satisfaction.     Hiram Gash

## 2019-10-22 ENCOUNTER — Encounter (HOSPITAL_BASED_OUTPATIENT_CLINIC_OR_DEPARTMENT_OTHER): Payer: Self-pay | Admitting: Orthopaedic Surgery

## 2019-10-26 DIAGNOSIS — S83281D Other tear of lateral meniscus, current injury, right knee, subsequent encounter: Secondary | ICD-10-CM | POA: Diagnosis not present

## 2019-11-30 DIAGNOSIS — S83281D Other tear of lateral meniscus, current injury, right knee, subsequent encounter: Secondary | ICD-10-CM | POA: Diagnosis not present

## 2019-11-30 DIAGNOSIS — M25561 Pain in right knee: Secondary | ICD-10-CM | POA: Diagnosis not present

## 2019-12-12 DIAGNOSIS — Z79899 Other long term (current) drug therapy: Secondary | ICD-10-CM | POA: Diagnosis not present

## 2019-12-12 DIAGNOSIS — Z9884 Bariatric surgery status: Secondary | ICD-10-CM | POA: Diagnosis not present

## 2019-12-12 DIAGNOSIS — I10 Essential (primary) hypertension: Secondary | ICD-10-CM | POA: Diagnosis not present

## 2019-12-12 DIAGNOSIS — R232 Flushing: Secondary | ICD-10-CM | POA: Diagnosis not present

## 2019-12-12 DIAGNOSIS — R002 Palpitations: Secondary | ICD-10-CM | POA: Diagnosis not present

## 2019-12-12 DIAGNOSIS — G4700 Insomnia, unspecified: Secondary | ICD-10-CM | POA: Diagnosis not present

## 2019-12-28 DIAGNOSIS — M1711 Unilateral primary osteoarthritis, right knee: Secondary | ICD-10-CM | POA: Diagnosis not present

## 2019-12-28 DIAGNOSIS — M25561 Pain in right knee: Secondary | ICD-10-CM | POA: Diagnosis not present

## 2020-01-04 DIAGNOSIS — M1711 Unilateral primary osteoarthritis, right knee: Secondary | ICD-10-CM | POA: Diagnosis not present

## 2020-01-11 DIAGNOSIS — M1711 Unilateral primary osteoarthritis, right knee: Secondary | ICD-10-CM | POA: Diagnosis not present

## 2020-06-04 DIAGNOSIS — J020 Streptococcal pharyngitis: Secondary | ICD-10-CM | POA: Diagnosis not present

## 2020-06-04 DIAGNOSIS — Z6836 Body mass index (BMI) 36.0-36.9, adult: Secondary | ICD-10-CM | POA: Diagnosis not present

## 2020-06-04 DIAGNOSIS — J209 Acute bronchitis, unspecified: Secondary | ICD-10-CM | POA: Diagnosis not present

## 2020-06-13 DIAGNOSIS — I10 Essential (primary) hypertension: Secondary | ICD-10-CM | POA: Diagnosis not present

## 2020-06-13 DIAGNOSIS — Z79899 Other long term (current) drug therapy: Secondary | ICD-10-CM | POA: Diagnosis not present

## 2020-06-13 DIAGNOSIS — Z9884 Bariatric surgery status: Secondary | ICD-10-CM | POA: Diagnosis not present

## 2020-06-13 DIAGNOSIS — Z1331 Encounter for screening for depression: Secondary | ICD-10-CM | POA: Diagnosis not present

## 2020-06-13 DIAGNOSIS — R002 Palpitations: Secondary | ICD-10-CM | POA: Diagnosis not present

## 2020-06-13 DIAGNOSIS — G4700 Insomnia, unspecified: Secondary | ICD-10-CM | POA: Diagnosis not present

## 2020-09-02 ENCOUNTER — Other Ambulatory Visit (HOSPITAL_BASED_OUTPATIENT_CLINIC_OR_DEPARTMENT_OTHER): Payer: Self-pay | Admitting: Family Medicine

## 2020-09-02 DIAGNOSIS — S0093XA Contusion of unspecified part of head, initial encounter: Secondary | ICD-10-CM | POA: Diagnosis not present

## 2020-09-02 DIAGNOSIS — M542 Cervicalgia: Secondary | ICD-10-CM | POA: Diagnosis not present

## 2020-09-02 DIAGNOSIS — R0781 Pleurodynia: Secondary | ICD-10-CM | POA: Diagnosis not present

## 2020-09-02 DIAGNOSIS — M546 Pain in thoracic spine: Secondary | ICD-10-CM | POA: Diagnosis not present

## 2020-09-02 DIAGNOSIS — T07XXXA Unspecified multiple injuries, initial encounter: Secondary | ICD-10-CM

## 2020-09-03 ENCOUNTER — Ambulatory Visit (HOSPITAL_BASED_OUTPATIENT_CLINIC_OR_DEPARTMENT_OTHER)
Admission: RE | Admit: 2020-09-03 | Discharge: 2020-09-03 | Disposition: A | Discharge status: Home / Self Care | Payer: BC Managed Care – PPO | Source: Ambulatory Visit | Attending: Family Medicine | Admitting: Family Medicine

## 2020-09-03 ENCOUNTER — Other Ambulatory Visit: Payer: Self-pay

## 2020-09-03 DIAGNOSIS — M50323 Other cervical disc degeneration at C6-C7 level: Secondary | ICD-10-CM | POA: Insufficient documentation

## 2020-09-03 DIAGNOSIS — Z043 Encounter for examination and observation following other accident: Secondary | ICD-10-CM | POA: Diagnosis not present

## 2020-09-03 DIAGNOSIS — T07XXXA Unspecified multiple injuries, initial encounter: Secondary | ICD-10-CM

## 2020-09-05 DIAGNOSIS — M1711 Unilateral primary osteoarthritis, right knee: Secondary | ICD-10-CM | POA: Diagnosis not present

## 2020-09-23 DIAGNOSIS — F410 Panic disorder [episodic paroxysmal anxiety] without agoraphobia: Secondary | ICD-10-CM | POA: Diagnosis not present

## 2020-09-23 DIAGNOSIS — M79604 Pain in right leg: Secondary | ICD-10-CM | POA: Diagnosis not present

## 2020-09-23 DIAGNOSIS — M25511 Pain in right shoulder: Secondary | ICD-10-CM | POA: Diagnosis not present

## 2020-09-23 DIAGNOSIS — M79605 Pain in left leg: Secondary | ICD-10-CM | POA: Diagnosis not present

## 2020-10-31 DIAGNOSIS — Z6836 Body mass index (BMI) 36.0-36.9, adult: Secondary | ICD-10-CM | POA: Diagnosis not present

## 2020-10-31 DIAGNOSIS — R21 Rash and other nonspecific skin eruption: Secondary | ICD-10-CM | POA: Diagnosis not present

## 2020-11-04 DIAGNOSIS — L299 Pruritus, unspecified: Secondary | ICD-10-CM | POA: Diagnosis not present

## 2020-11-04 DIAGNOSIS — L501 Idiopathic urticaria: Secondary | ICD-10-CM | POA: Diagnosis not present

## 2020-11-24 ENCOUNTER — Other Ambulatory Visit: Payer: Self-pay

## 2020-11-24 ENCOUNTER — Emergency Department (HOSPITAL_COMMUNITY)
Admission: EM | Admit: 2020-11-24 | Discharge: 2020-11-24 | Disposition: A | Discharge status: Home / Self Care | Payer: BC Managed Care – PPO | Attending: Emergency Medicine | Admitting: Emergency Medicine

## 2020-11-24 ENCOUNTER — Encounter (HOSPITAL_COMMUNITY): Payer: Self-pay

## 2020-11-24 ENCOUNTER — Emergency Department (HOSPITAL_COMMUNITY): Payer: BC Managed Care – PPO

## 2020-11-24 DIAGNOSIS — Z5321 Procedure and treatment not carried out due to patient leaving prior to being seen by health care provider: Secondary | ICD-10-CM | POA: Insufficient documentation

## 2020-11-24 DIAGNOSIS — I10 Essential (primary) hypertension: Secondary | ICD-10-CM | POA: Diagnosis not present

## 2020-11-24 DIAGNOSIS — R52 Pain, unspecified: Secondary | ICD-10-CM | POA: Diagnosis not present

## 2020-11-24 DIAGNOSIS — M79602 Pain in left arm: Secondary | ICD-10-CM | POA: Insufficient documentation

## 2020-11-24 DIAGNOSIS — R0689 Other abnormalities of breathing: Secondary | ICD-10-CM | POA: Diagnosis not present
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
## 2021-01-07 DIAGNOSIS — G4700 Insomnia, unspecified: Secondary | ICD-10-CM | POA: Diagnosis not present

## 2021-01-07 DIAGNOSIS — R002 Palpitations: Secondary | ICD-10-CM | POA: Diagnosis not present

## 2021-01-07 DIAGNOSIS — Z9884 Bariatric surgery status: Secondary | ICD-10-CM | POA: Diagnosis not present

## 2021-01-07 DIAGNOSIS — Z79899 Other long term (current) drug therapy: Secondary | ICD-10-CM | POA: Diagnosis not present

## 2021-01-07 DIAGNOSIS — I10 Essential (primary) hypertension: Secondary | ICD-10-CM | POA: Diagnosis not present

## 2021-02-18 DIAGNOSIS — E538 Deficiency of other specified B group vitamins: Secondary | ICD-10-CM | POA: Diagnosis not present

## 2021-02-18 DIAGNOSIS — I10 Essential (primary) hypertension: Secondary | ICD-10-CM | POA: Diagnosis not present

## 2021-02-24 DIAGNOSIS — R945 Abnormal results of liver function studies: Secondary | ICD-10-CM | POA: Diagnosis not present

## 2021-02-24 DIAGNOSIS — R748 Abnormal levels of other serum enzymes: Secondary | ICD-10-CM | POA: Diagnosis not present

## 2021-02-24 DIAGNOSIS — Z9049 Acquired absence of other specified parts of digestive tract: Secondary | ICD-10-CM | POA: Diagnosis not present

## 2021-02-27 DIAGNOSIS — M359 Systemic involvement of connective tissue, unspecified: Secondary | ICD-10-CM | POA: Diagnosis not present

## 2021-02-27 DIAGNOSIS — R748 Abnormal levels of other serum enzymes: Secondary | ICD-10-CM | POA: Diagnosis not present

## 2021-02-27 DIAGNOSIS — K921 Melena: Secondary | ICD-10-CM | POA: Diagnosis not present

## 2021-02-27 DIAGNOSIS — Z6834 Body mass index (BMI) 34.0-34.9, adult: Secondary | ICD-10-CM | POA: Diagnosis not present

## 2021-03-03 DIAGNOSIS — R197 Diarrhea, unspecified: Secondary | ICD-10-CM | POA: Diagnosis not present

## 2021-03-03 DIAGNOSIS — R7989 Other specified abnormal findings of blood chemistry: Secondary | ICD-10-CM | POA: Diagnosis not present

## 2021-03-03 DIAGNOSIS — K625 Hemorrhage of anus and rectum: Secondary | ICD-10-CM | POA: Diagnosis not present

## 2021-03-09 DIAGNOSIS — Z1231 Encounter for screening mammogram for malignant neoplasm of breast: Secondary | ICD-10-CM | POA: Diagnosis not present

## 2021-03-17 DIAGNOSIS — R7989 Other specified abnormal findings of blood chemistry: Secondary | ICD-10-CM | POA: Diagnosis not present

## 2021-03-17 DIAGNOSIS — R945 Abnormal results of liver function studies: Secondary | ICD-10-CM | POA: Diagnosis not present

## 2021-03-23 DIAGNOSIS — K900 Celiac disease: Secondary | ICD-10-CM | POA: Diagnosis not present

## 2021-03-23 DIAGNOSIS — K3189 Other diseases of stomach and duodenum: Secondary | ICD-10-CM | POA: Diagnosis not present

## 2021-03-23 DIAGNOSIS — K625 Hemorrhage of anus and rectum: Secondary | ICD-10-CM | POA: Diagnosis not present

## 2021-03-23 DIAGNOSIS — D123 Benign neoplasm of transverse colon: Secondary | ICD-10-CM | POA: Diagnosis not present

## 2021-03-23 DIAGNOSIS — Z9884 Bariatric surgery status: Secondary | ICD-10-CM | POA: Diagnosis not present

## 2021-03-23 DIAGNOSIS — K5730 Diverticulosis of large intestine without perforation or abscess without bleeding: Secondary | ICD-10-CM | POA: Diagnosis not present

## 2021-03-23 DIAGNOSIS — K635 Polyp of colon: Secondary | ICD-10-CM | POA: Diagnosis not present

## 2021-03-23 DIAGNOSIS — Z8619 Personal history of other infectious and parasitic diseases: Secondary | ICD-10-CM | POA: Diagnosis not present

## 2021-03-23 DIAGNOSIS — R197 Diarrhea, unspecified: Secondary | ICD-10-CM | POA: Diagnosis not present

## 2021-03-31 DIAGNOSIS — D509 Iron deficiency anemia, unspecified: Secondary | ICD-10-CM | POA: Diagnosis not present

## 2021-03-31 DIAGNOSIS — R748 Abnormal levels of other serum enzymes: Secondary | ICD-10-CM | POA: Diagnosis not present

## 2021-03-31 DIAGNOSIS — Z1212 Encounter for screening for malignant neoplasm of rectum: Secondary | ICD-10-CM | POA: Diagnosis not present

## 2021-03-31 DIAGNOSIS — K580 Irritable bowel syndrome with diarrhea: Secondary | ICD-10-CM | POA: Diagnosis not present

## 2021-04-06 DIAGNOSIS — R7989 Other specified abnormal findings of blood chemistry: Secondary | ICD-10-CM | POA: Diagnosis not present

## 2021-04-06 DIAGNOSIS — K7581 Nonalcoholic steatohepatitis (NASH): Secondary | ICD-10-CM | POA: Diagnosis not present

## 2021-04-06 DIAGNOSIS — R748 Abnormal levels of other serum enzymes: Secondary | ICD-10-CM | POA: Diagnosis not present

## 2021-04-10 ENCOUNTER — Ambulatory Visit: Payer: BC Managed Care – PPO | Admitting: Gastroenterology

## 2021-05-15 DIAGNOSIS — Z20822 Contact with and (suspected) exposure to covid-19: Secondary | ICD-10-CM | POA: Diagnosis not present

## 2021-06-18 DIAGNOSIS — M1711 Unilateral primary osteoarthritis, right knee: Secondary | ICD-10-CM | POA: Diagnosis not present

## 2021-07-09 DIAGNOSIS — Z9884 Bariatric surgery status: Secondary | ICD-10-CM | POA: Diagnosis not present

## 2021-07-09 DIAGNOSIS — M359 Systemic involvement of connective tissue, unspecified: Secondary | ICD-10-CM | POA: Diagnosis not present

## 2021-07-09 DIAGNOSIS — R002 Palpitations: Secondary | ICD-10-CM | POA: Diagnosis not present

## 2021-07-09 DIAGNOSIS — I10 Essential (primary) hypertension: Secondary | ICD-10-CM | POA: Diagnosis not present

## 2021-07-09 DIAGNOSIS — Z1331 Encounter for screening for depression: Secondary | ICD-10-CM | POA: Diagnosis not present

## 2021-07-09 DIAGNOSIS — R748 Abnormal levels of other serum enzymes: Secondary | ICD-10-CM | POA: Diagnosis not present

## 2021-10-16 DIAGNOSIS — M25561 Pain in right knee: Secondary | ICD-10-CM | POA: Diagnosis not present

## 2021-10-16 DIAGNOSIS — M25461 Effusion, right knee: Secondary | ICD-10-CM | POA: Diagnosis not present

## 2021-10-16 DIAGNOSIS — M1711 Unilateral primary osteoarthritis, right knee: Secondary | ICD-10-CM | POA: Diagnosis not present

## 2021-10-23 DIAGNOSIS — I10 Essential (primary) hypertension: Secondary | ICD-10-CM | POA: Diagnosis not present

## 2021-10-23 DIAGNOSIS — M25561 Pain in right knee: Secondary | ICD-10-CM | POA: Diagnosis not present

## 2021-10-23 DIAGNOSIS — Z01818 Encounter for other preprocedural examination: Secondary | ICD-10-CM | POA: Diagnosis not present

## 2021-10-23 DIAGNOSIS — R002 Palpitations: Secondary | ICD-10-CM | POA: Diagnosis not present
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
## 2021-10-26 ENCOUNTER — Other Ambulatory Visit (HOSPITAL_COMMUNITY): Payer: Self-pay | Admitting: Orthopedic Surgery

## 2021-10-26 DIAGNOSIS — M1990 Unspecified osteoarthritis, unspecified site: Secondary | ICD-10-CM

## 2021-10-26 NOTE — Progress Notes (Signed)
Pt. Needs orders for upcoming surgery.PAT and labs on 10/28/21.

## 2021-10-26 NOTE — Patient Instructions (Signed)
DUE TO COVID-19 ONLY ONE VISITOR IS ALLOWED TO COME WITH YOU AND STAY IN THE WAITING ROOM ONLY DURING PRE OP AND PROCEDURE.   **NO VISITORS ARE ALLOWED IN THE SHORT STAY AREA OR RECOVERY ROOM!!**  IF YOU WILL BE ADMITTED INTO THE HOSPITAL YOU ARE ALLOWED ONLY TWO SUPPORT PEOPLE DURING VISITATION HOURS ONLY (7 AM -8PM)   The support person(s) must pass our screening, gel in and out, and wear a mask at all times, including in the patient's room. Patients must also wear a mask when staff or their support person are in the room. Visitors GUEST BADGE MUST BE WORN VISIBLY  One adult visitor may remain with you overnight and MUST be in the room by 8 P.M.  No visitors under the age of 64. Any visitor under the age of 6 must be accompanied by an adult.    COVID SWAB TESTING MUST BE COMPLETED ON:  11/04/21 **MUST PRESENT COMPLETED FORM AT TESTING SITE**    [place] (backside of the building) You are not required to quarantine, however you are required to wear a well-fitted mask when you are out and around people not in your household.  Hand Hygiene often Do NOT share personal items Notify your provider if you are in close contact with someone who has COVID or you develop fever 100.4 or greater, new onset of sneezing, cough, sore throat, shortness of breath or body aches.  [place], [address], must go inside of the hospital, NOT A DRIVE THRU!  (Must self quarantine after testing. Follow instructions on handout.)       Your procedure is scheduled on: 11/06/21   Report to [facility] Main Entrance    Report to admitting at : 7:30 AM   Call this number if you have problems the morning of surgery [phone]   Do not eat food :After Midnight.   May have liquids until : 7:00 AM   day of surgery  CLEAR LIQUID DIET  Foods Allowed                                                                      Foods Excluded  Water, Black Coffee and tea, regular and decaf                             liquids that you cannot  Plain Jell-O in any flavor  (No red)                                           see through such as: Fruit ices (not with fruit pulp)                                     milk, soups, orange juice              Iced Popsicles (No red)  All solid food                                   Apple juices Sports drinks like Gatorade (No red) Lightly seasoned clear broth or consume(fat free) Sugar  Sample Menu Breakfast                                Lunch                                     Supper Cranberry juice                    Beef broth                            Chicken broth Jell-O                                     Grape juice                           Apple juice Coffee or tea                        Jell-O                                      Popsicle                                                Coffee or tea                        Coffee or tea    Complete one Ensure drink the morning of surgery at       the day of surgery.    The day of surgery:  Drink ONE (1) Pre-Surgery Clear Ensure or G2 by am the morning of surgery. Drink in one sitting. Do not sip.  This drink was given to you during your hospital  pre-op appointment visit. Nothing else to drink after completing the  Pre-Surgery Clear Ensure or G2.          If you have questions, please contact your surgeon's office.     Oral Hygiene is also important to reduce your risk of infection.                                    Remember - BRUSH YOUR TEETH THE MORNING OF SURGERY WITH YOUR REGULAR TOOTHPASTE   Do NOT smoke after Midnight   Take these medicines the morning of surgery with A SIP OF WATER: propranolol,omeprazole.                              You may not have  any metal on your body including hair pins, jewelry, and body piercing             Do not wear make-up, lotions,  powders, perfumes/cologne, or deodorant  Do not wear nail polish including gel and S&S, artificial/acrylic nails, or any other type of covering on natural nails including finger and toenails. If you have artificial nails, gel coating, etc. that needs to be removed by a nail salon please have this removed prior to surgery or surgery may need to be canceled/ delayed if the surgeon/ anesthesia feels like they are unable to be safely monitored.   Do not shave  48 hours prior to surgery.    Do not bring valuables to the hospital. [place].   Contacts, dentures or bridgework may not be worn into surgery.   Bring small overnight bag day of surgery.    Patients discharged on the day of surgery will not be allowed to drive home.   Special Instructions: Bring a copy of your healthcare power of attorney and living will documents         the day of surgery if you haven't scanned them before.              Please read over the following fact sheets you were given: IF YOU HAVE QUESTIONS ABOUT YOUR PRE-OP INSTRUCTIONS PLEASE CALL [phone]     [facility] - Preparing for Surgery Before surgery, you can play an important role.  Because skin is not sterile, your skin needs to be as free of germs as possible.  You can reduce the number of germs on your skin by washing with CHG (chlorahexidine gluconate) soap before surgery.  CHG is an antiseptic cleaner which kills germs and bonds with the skin to continue killing germs even after washing. Please DO NOT use if you have an allergy to CHG or antibacterial soaps.  If your skin becomes reddened/irritated stop using the CHG and inform your nurse when you arrive at Short Stay. Do not shave (including legs and underarms) for at least 48 hours prior to the first CHG shower.  You may shave your face/neck. Please follow these instructions carefully:  1.  Shower with CHG Soap the night before surgery and the  morning of  Surgery.  2.  If you choose to wash your hair, wash your hair first as usual with your  normal  shampoo.  3.  After you shampoo, rinse your hair and body thoroughly to remove the  shampoo.                           4.  Use CHG as you would any other liquid soap.  You can apply chg directly  to the skin and wash                       Gently with a scrungie or clean washcloth.  5.  Apply the CHG Soap to your body ONLY FROM THE NECK DOWN.   Do not use on face/ open                           Wound or open sores. Avoid contact with eyes, ears mouth and genitals (private parts).  Wash face,  Genitals (private parts) with your normal soap.             6.  Wash thoroughly, paying special attention to the area where your surgery  will be performed.  7.  Thoroughly rinse your body with warm water from the neck down.  8.  DO NOT shower/wash with your normal soap after using and rinsing off  the CHG Soap.                9.  Pat yourself dry with a clean towel.            10.  Wear clean pajamas.            11.  Place clean sheets on your bed the night of your first shower and do not  sleep with pets. Day of Surgery : Do not apply any lotions/deodorants the morning of surgery.  Please wear clean clothes to the hospital/surgery center.  FAILURE TO FOLLOW THESE INSTRUCTIONS MAY RESULT IN THE CANCELLATION OF YOUR SURGERY PATIENT SIGNATURE_________________________________  NURSE SIGNATURE__________________________________  ________________________________________________________________________

## 2021-10-27 ENCOUNTER — Ambulatory Visit
Admission: RE | Admit: 2021-10-27 | Discharge: 2021-10-27 | Disposition: A | Discharge status: Home / Self Care | Payer: BC Managed Care – PPO | Source: Ambulatory Visit | Attending: Orthopedic Surgery | Admitting: Orthopedic Surgery

## 2021-10-27 ENCOUNTER — Inpatient Hospital Stay: Admission: RE | Admit: 2021-10-27 | Payer: BC Managed Care – PPO | Source: Ambulatory Visit

## 2021-10-27 ENCOUNTER — Other Ambulatory Visit: Payer: Self-pay | Admitting: Orthopedic Surgery

## 2021-10-27 DIAGNOSIS — M19071 Primary osteoarthritis, right ankle and foot: Secondary | ICD-10-CM | POA: Diagnosis not present

## 2021-10-27 DIAGNOSIS — M1990 Unspecified osteoarthritis, unspecified site: Secondary | ICD-10-CM

## 2021-10-27 DIAGNOSIS — M25461 Effusion, right knee: Secondary | ICD-10-CM | POA: Diagnosis not present

## 2021-10-27 DIAGNOSIS — Z90710 Acquired absence of both cervix and uterus: Secondary | ICD-10-CM | POA: Diagnosis not present

## 2021-10-27 DIAGNOSIS — M1711 Unilateral primary osteoarthritis, right knee: Secondary | ICD-10-CM | POA: Diagnosis not present

## 2021-10-28 ENCOUNTER — Encounter (HOSPITAL_COMMUNITY)
Admission: RE | Admit: 2021-10-28 | Discharge: 2021-10-28 | Disposition: A | Discharge status: Home / Self Care | Payer: BC Managed Care – PPO | Source: Ambulatory Visit | Attending: Orthopedic Surgery | Admitting: Orthopedic Surgery

## 2021-10-28 DIAGNOSIS — I2510 Atherosclerotic heart disease of native coronary artery without angina pectoris: Secondary | ICD-10-CM

## 2021-11-16 DIAGNOSIS — M1711 Unilateral primary osteoarthritis, right knee: Secondary | ICD-10-CM | POA: Diagnosis not present

## 2021-11-17 ENCOUNTER — Ambulatory Visit: Payer: Self-pay | Admitting: Physician Assistant

## 2021-11-18 DIAGNOSIS — Z01818 Encounter for other preprocedural examination: Secondary | ICD-10-CM | POA: Diagnosis not present

## 2021-11-18 DIAGNOSIS — I10 Essential (primary) hypertension: Secondary | ICD-10-CM | POA: Diagnosis not present

## 2021-11-23 NOTE — Patient Instructions (Addendum)
DUE TO COVID-19 ONLY ONE VISITOR IS ALLOWED TO COME WITH YOU AND STAY IN THE WAITING ROOM ONLY DURING PRE OP AND PROCEDURE.   **NO VISITORS ARE ALLOWED IN THE SHORT STAY AREA OR RECOVERY ROOM!!**  IF YOU WILL BE ADMITTED INTO THE HOSPITAL YOU ARE ALLOWED ONLY TWO SUPPORT PEOPLE DURING VISITATION HOURS ONLY (7AM -8PM)   The support person(s) may change daily. The support person(s) must pass our screening, gel in and out, and wear a mask at all times, including in the patient's room. Patients must also wear a mask when staff or their support person are in the room.  No visitors under the age of 7. Any visitor under the age of 88 must be accompanied by an adult.    COVID SWAB TESTING MUST BE COMPLETED ON:  12/03/21 **MUST PRESENT COMPLETED FORM AT TESTING SITE**    [place] (backside of the building) You are not required to quarantine, however you are required to wear a well-fitted mask when you are out and around people not in your household.  Hand Hygiene often Do NOT share personal items Notify your provider if you are in close contact with someone who has COVID or you develop fever 100.4 or greater, new onset of sneezing, cough, sore throat, shortness of breath or body aches.       Your procedure is scheduled on: 12/07/21   Report to [facility] Main Entrance    Report to admitting at 5:15 AM   Call this number if you have problems the morning of surgery [phone]   Do not eat food :After Midnight.   May have liquids until 4:15 AM day of surgery  CLEAR LIQUID DIET  Foods Allowed                                                                     Foods Excluded  Water, Black Coffee and tea (no milk or creamer)          liquids that you cannot  Plain Jell-O in any flavor  (No red)                                   see through such as: Fruit ices (not with fruit pulp)                                           milk, soups, orange juice               Iced Popsicles (No red)                                              All solid food                                   Apple juices Sports drinks like Gatorade (No red) Lightly  seasoned clear broth or consume(fat free) Sugar  Oral Hygiene is also important to reduce your risk of infection.                                    Remember - BRUSH YOUR TEETH THE MORNING OF SURGERY WITH YOUR REGULAR TOOTHPASTE   Take these medicines the morning of surgery with A SIP OF WATER: Protonix, Propranolol.                               You may not have any metal on your body including hair pins, jewelry, and body piercing             Do not wear make-up, lotions, powders, perfumes, or deodorant  Do not wear nail polish including gel and S&S, artificial/acrylic nails, or any other type of covering on natural nails including finger and toenails. If you have artificial nails, gel coating, etc. that needs to be removed by a nail salon please have this removed prior to surgery or surgery may need to be canceled/ delayed if the surgeon/ anesthesia feels like they are unable to be safely monitored.   Do not shave  48 hours prior to surgery.    Do not bring valuables to the hospital. [place].   Bring small overnight bag day of surgery.               Please read over the following fact sheets you were given: IF YOU HAVE QUESTIONS ABOUT YOUR PRE-OP INSTRUCTIONS PLEASE CALL [place] - Preparing for Surgery Before surgery, you can play an important role.  Because skin is not sterile, your skin needs to be as free of germs as possible.  You can reduce the number of germs on your skin by washing with CHG (chlorahexidine gluconate) soap before surgery.  CHG is an antiseptic cleaner which kills germs and bonds with the skin to continue killing germs even after washing. Please DO NOT use if you have an allergy to CHG or antibacterial soaps.   If your skin becomes reddened/irritated stop using the CHG and inform your nurse when you arrive at Short Stay. Do not shave (including legs and underarms) for at least 48 hours prior to the first CHG shower.  You may shave your face/neck.  Please follow these instructions carefully:  1.  Shower with CHG Soap the night before surgery and the  morning of surgery.  2.  If you choose to wash your hair, wash your hair first as usual with your normal  shampoo.  3.  After you shampoo, rinse your hair and body thoroughly to remove the shampoo.                             4.  Use CHG as you would any other liquid soap.  You can apply chg directly to the skin and wash.  Gently with a scrungie or clean washcloth.  5.  Apply the CHG Soap to your body ONLY FROM THE NECK DOWN.   Do   not use on face/ open  Wound or open sores. Avoid contact with eyes, ears mouth and   genitals (private parts).                       Wash face,  Genitals (private parts) with your normal soap.             6.  Wash thoroughly, paying special attention to the area where your    surgery  will be performed.  7.  Thoroughly rinse your body with warm water from the neck down.  8.  DO NOT shower/wash with your normal soap after using and rinsing off the CHG Soap.                9.  Pat yourself dry with a clean towel.            10.  Wear clean pajamas.            11.  Place clean sheets on your bed the night of your first shower and do not  sleep with pets. Day of Surgery : Do not apply any lotions/deodorants the morning of surgery.  Please wear clean clothes to the hospital/surgery center.  FAILURE TO FOLLOW THESE INSTRUCTIONS MAY RESULT IN THE CANCELLATION OF YOUR SURGERY  PATIENT SIGNATURE_________________________________  NURSE SIGNATURE__________________________________  ________________________________________________________________________   Shirley Cameron  An incentive spirometer is a  tool that can help keep your lungs clear and active. This tool measures how well you are filling your lungs with each breath. Taking long deep breaths may help reverse or decrease the chance of developing breathing (pulmonary) problems (especially infection) following: A long period of time when you are unable to move or be active. BEFORE THE PROCEDURE  If the spirometer includes an indicator to show your best effort, your nurse or respiratory therapist will set it to a desired goal. If possible, sit up straight or lean slightly forward. Try not to slouch. Hold the incentive spirometer in an upright position. INSTRUCTIONS FOR USE  Sit on the edge of your bed if possible, or sit up as far as you can in bed or on a chair. Hold the incentive spirometer in an upright position. Breathe out normally. Place the mouthpiece in your mouth and seal your lips tightly around it. Breathe in slowly and as deeply as possible, raising the piston or the ball toward the top of the column. Hold your breath for 3-5 seconds or for as long as possible. Allow the piston or ball to fall to the bottom of the column. Remove the mouthpiece from your mouth and breathe out normally. Rest for a few seconds and repeat Steps 1 through 7 at least 10 times every 1-2 hours when you are awake. Take your time and take a few normal breaths between deep breaths. The spirometer may include an indicator to show your best effort. Use the indicator as a goal to work toward during each repetition. After each set of 10 deep breaths, practice coughing to be sure your lungs are clear. If you have an incision (the cut made at the time of surgery), support your incision when coughing by placing a pillow or rolled up towels firmly against it. Once you are able to get out of bed, walk around indoors and cough well. You may stop using the incentive spirometer when instructed by your caregiver.  RISKS AND COMPLICATIONS Take your time so you do not  get dizzy or light-headed. If you are in pain, you  may need to take or ask for pain medication before doing incentive spirometry. It is harder to take a deep breath if you are having pain. AFTER USE Rest and breathe slowly and easily. It can be helpful to keep track of a log of your progress. Your caregiver can provide you with a simple table to help with this. If you are using the spirometer at home, follow these instructions: [place] IF:  You are having difficultly using the spirometer. You have trouble using the spirometer as often as instructed. Your pain medication is not giving enough relief while using the spirometer. You develop fever of 100.5 F (38.1 C) or higher. SEEK IMMEDIATE MEDICAL CARE IF:  You cough up bloody sputum that had not been present before. You develop fever of 102 F (38.9 C) or greater. You develop worsening pain at or near the incision site. MAKE SURE YOU:  Understand these instructions. Will watch your condition. Will get help right away if you are not doing well or get worse. Document Released: 04/18/2007 Document Revised: 02/28/2012 Document Reviewed: 06/19/2007 [facility] Patient Information 2014 [place], [place].   ________________________________________________________________________

## 2021-11-23 NOTE — Progress Notes (Addendum)
COVID swab appointment: 12/03/21  COVID Vaccine Completed: yes x2 Date COVID Vaccine completed: Has received booster: COVID vaccine manufacturer: Pfizer      Date of COVID positive in last 90 days: no  PCP - Cyndi Bender, PA [facility] Cardiologist - n/a  Chest x-ray - n/a EKG - 10/22/21 on chart Stress Test - n/a ECHO - n/a Cardiac Cath - n/a Pacemaker/ICD device last checked: n/a Spinal Cord Stimulator: n/a  Sleep Study - n/a CPAP -   Fasting Blood Sugar - n/a Checks Blood Sugar _____ times a day  Blood Thinner Instructions: n/a Aspirin Instructions: Last Dose:  Activity level: Can go up a flight of stairs and perform activities of daily living without stopping and without symptoms of chest pain. SOB with exertion and increased pain       Anesthesia review: pt states she is hysterical after waking up from anesthesia, HTN, BP 148/116 patient denies any symptoms other than 8/10 right knee pain. She has an appointment today with her family doctor and will discuss BP with them today.  Patient denies shortness of breath, fever, cough and chest pain at PAT appointment   Patient verbalized understanding of instructions that were given to them at the PAT appointment. Patient was also instructed that they will need to review over the PAT instructions again at home before surgery.

## 2021-11-24 ENCOUNTER — Encounter (HOSPITAL_COMMUNITY)
Admission: RE | Admit: 2021-11-24 | Discharge: 2021-11-24 | Disposition: A | Discharge status: Home / Self Care | Payer: BC Managed Care – PPO | Source: Ambulatory Visit | Attending: Orthopedic Surgery | Admitting: Orthopedic Surgery

## 2021-11-24 ENCOUNTER — Encounter (HOSPITAL_COMMUNITY): Payer: Self-pay

## 2021-11-24 ENCOUNTER — Ambulatory Visit: Payer: Self-pay | Admitting: Physician Assistant

## 2021-11-24 VITALS — BP 148/116 | HR 65 | Temp 99.2°F | Resp 16 | Ht 66.0 in

## 2021-11-24 DIAGNOSIS — I2510 Atherosclerotic heart disease of native coronary artery without angina pectoris: Secondary | ICD-10-CM

## 2021-11-24 DIAGNOSIS — Z01812 Encounter for preprocedural laboratory examination: Secondary | ICD-10-CM | POA: Insufficient documentation

## 2021-11-24 DIAGNOSIS — Z01818 Encounter for other preprocedural examination: Secondary | ICD-10-CM

## 2021-11-24 HISTORY — DX: Pneumonia, unspecified organism: J18.9

## 2021-11-24 HISTORY — DX: Abnormal levels of other serum enzymes: R74.8

## 2021-11-24 LAB — COMPREHENSIVE METABOLIC PANEL
ALT: 29 U/L (ref 0–44)
AST: 23 U/L (ref 15–41)
Albumin: 3.6 g/dL (ref 3.5–5.0)
Alkaline Phosphatase: 68 U/L (ref 38–126)
Anion gap: 7 (ref 5–15)
BUN: 11 mg/dL (ref 6–20)
CO2: 25 mmol/L (ref 22–32)
Calcium: 8.6 mg/dL — ABNORMAL LOW (ref 8.9–10.3)
Chloride: 104 mmol/L (ref 98–111)
Creatinine, Ser: 0.65 mg/dL (ref 0.44–1.00)
GFR, Estimated: >60 mL/min (ref >60)
Glucose, Bld: 97 mg/dL (ref 70–99)
Potassium: 3.9 mmol/L (ref 3.5–5.1)
Sodium: 136 mmol/L (ref 135–145)
Total Bilirubin: 0.7 mg/dL (ref 0.3–1.2)
Total Protein: 6.8 g/dL (ref 6.5–8.1)

## 2021-11-24 LAB — CBC
HCT: 33.3 % — ABNORMAL LOW (ref 36.0–46.0)
Hemoglobin: 10.6 g/dL — ABNORMAL LOW (ref 12.0–15.0)
MCH: 25.9 pg — ABNORMAL LOW (ref 26.0–34.0)
MCHC: 31.8 g/dL (ref 30.0–36.0)
MCV: 81.2 fL (ref 80.0–100.0)
Platelets: 323 10*3/uL (ref 150–400)
RBC: 4.1 MIL/uL (ref 3.87–5.11)
RDW: 14.4 % (ref 11.5–15.5)
WBC: 5.1 10*3/uL (ref 4.0–10.5)
nRBC: 0 % (ref 0.0–0.2)

## 2021-11-24 LAB — SURGICAL PCR SCREEN
MRSA, PCR: NEGATIVE
Staphylococcus aureus: NEGATIVE

## 2021-11-24 LAB — PROTIME-INR
INR: 0.9 (ref 0.8–1.2)
Prothrombin Time: 12.6 seconds (ref 11.4–15.2)

## 2021-11-24 LAB — APTT: aPTT: 25 seconds (ref 24–36)

## 2021-11-24 NOTE — H&P (View-Only) (Signed)
TOTAL KNEE ADMISSION H&P  Patient is being admitted for right total knee arthroplasty.  Subjective:  Chief Complaint:right knee pain.  HPI: Shirley Cameron, 50 y.o. female, has a history of pain and functional disability in the right knee due to trauma and has failed non-surgical conservative treatments for greater than 12 weeks to includeNSAID's and/or analgesics, corticosteriod injections, and activity modification.  Onset of symptoms was gradual, starting 5 years ago with gradually worsening course since that time. The patient noted prior procedures on the knee to include  high tibial medial osteotomy, closing wedge for varus producing an MCL reconstruction.  Progressively worsening pain over the years.  Status post hardware removal for osteomyelitis in 2020 with Dr. Griffin Basil.  Treated with several months of IV antibiotics.  Following underwent a right knee arthroscopy, chondroplasty and partial lateral meniscectomy in October of 2020.  on the right knee(s).  Patient currently rates pain in the right knee(s) at 10 out of 10 with activity. Patient has night pain, worsening of pain with activity and weight bearing, pain that interferes with activities of daily living, pain with passive range of motion, crepitus, and joint swelling.  Patient has evidence of periarticular osteophytes and joint space narrowing by imaging studies. This patient has had failure of previous osteotomy. There is no active infection.  There are no problems to display for this patient.  Past Medical History:  Diagnosis Date   Anemia    Anxiety    Arthritis    Asthma    Cancer ([place])    oral and cervical   Complication of anesthesia    very slow/ hard to wake up and usually has itching   Depression    Elevated liver enzymes    GERD (gastroesophageal reflux disease)    Headache    Hypertension    Pneumonia    PTSD (post-traumatic stress disorder)     Past Surgical History:  Procedure Laterality Date    ABDOMINAL HYSTERECTOMY     ARTHROSCOPIC REPAIR ACL     BREAST SURGERY     CHOLECYSTECTOMY     CHONDROPLASTY Right 10/18/2019   Procedure: CHONDROPLASTY;  Surgeon: Hiram Gash, MD;  Location: [facility];  Service: Orthopedics;  Laterality: Right;   EXPLORATORY LAPAROTOMY     FRACTURE SURGERY     ankle   GASTRIC ROUX-EN-Y  12/19/2008   HARDWARE REMOVAL     KNEE ARTHROSCOPY WITH LATERAL MENISECTOMY Right 10/18/2019   Procedure: KNEE ARTHROSCOPY WITH LATERAL MENISECTOMY;  Surgeon: Hiram Gash, MD;  Location: [facility];  Service: Orthopedics;  Laterality: Right;   OSTEOTOMY AND TIBIAL SHORTENING     TONSILLECTOMY     WISDOM TOOTH EXTRACTION      Current Outpatient Medications  Medication Sig Dispense Refill Last Dose   hydrOXYzine (ATARAX) 50 MG tablet Take 50 mg by mouth 3 (three) times daily as needed for itching.      lisinopril (ZESTRIL) 10 MG tablet Take 10 mg by mouth daily.      ondansetron (ZOFRAN) 8 MG tablet Take 8 mg by mouth every 8 (eight) hours as needed for nausea or vomiting.      pantoprazole (PROTONIX) 40 MG tablet Take 40 mg by mouth daily.      promethazine (PHENERGAN) 25 MG tablet Take 25 mg by mouth every 6 (six) hours as needed for nausea or vomiting.      propranolol (INDERAL) 80 MG tablet Take 120 mg by mouth 2 (two) times daily.  tiZANidine (ZANAFLEX) 4 MG tablet Take 4-8 mg by mouth at bedtime.      No current facility-administered medications for this visit.   Allergies  Allergen Reactions   Nsaids     H/o gastric bypass   Penicillins Hives   Cephalexin Itching and Rash   Sulfamethoxazole-Trimethoprim Itching and Rash    Social History   Tobacco Use   Smoking status: Never   Smokeless tobacco: Never  Substance Use Topics   Alcohol use: Yes    Comment: rare    No family history on file.   Review of Systems  Cardiovascular:  Positive for chest pain and palpitations.  Gastrointestinal:  Positive for  constipation, diarrhea, nausea and vomiting.  Musculoskeletal:  Positive for arthralgias.  Skin:  Positive for rash.  Neurological:  Positive for headaches.  Hematological:  Bruises/bleeds easily.  Psychiatric/Behavioral:  The patient is nervous/anxious.   All other systems reviewed and are negative.  Objective:  Physical Exam Constitutional:      General: She is not in acute distress.    Appearance: Normal appearance.  HENT:     Head: Normocephalic and atraumatic.  Eyes:     Extraocular Movements: Extraocular movements intact.     Pupils: Pupils are equal, round, and reactive to light.  Cardiovascular:     Rate and Rhythm: Tachycardia present.     Pulses: Normal pulses.     Heart sounds: Normal heart sounds.  Pulmonary:     Effort: Pulmonary effort is normal. No respiratory distress.     Breath sounds: Normal breath sounds.  Abdominal:     General: Abdomen is flat. Bowel sounds are normal. There is no distension.     Palpations: Abdomen is soft.     Tenderness: There is no abdominal tenderness.  Musculoskeletal:     Cervical back: Normal range of motion and neck supple.     Comments: Examination of the right lower extremity again shows she is neurovascularly intact.  Well healed surgical incisions.  She has intact dorsiflexion and plantarflexion with the ankle.  There is no calf tenderness to palpation.    Lymphadenopathy:     Cervical: No cervical adenopathy.  Skin:    General: Skin is warm and dry.     Coloration: Skin is not jaundiced.     Findings: No erythema or rash.  Neurological:     General: No focal deficit present.     Mental Status: She is alert and oriented to person, place, and time.  Psychiatric:        Mood and Affect: Mood normal.        Behavior: Behavior normal.    Vital signs in last 24 hours: @VSRANGES @  Labs:   Estimated body mass index is 36.79 kg/m as calculated from the following:   Height as of an earlier encounter on 11/24/21: 5\' 6"   (1.676 m).   Weight as of 10/18/19: 103.4 kg.   Imaging Review Plain radiographs demonstrate severe degenerative joint disease of the right knee(s). The overall alignment issignificant varus. The bone quality appears to be good for age and reported activity level.      Assessment/Plan:  End stage arthritis, right knee   The patient history, physical examination, clinical judgment of the provider and imaging studies are consistent with end stage degenerative joint disease of the right knee(s) and total knee arthroplasty is deemed medically necessary. The treatment options including medical management, injection therapy arthroscopy and arthroplasty were discussed at length. The risks and benefits  of total knee arthroplasty were presented and reviewed. The risks due to aseptic loosening, infection, stiffness, patella tracking problems, thromboembolic complications and other imponderables were discussed. The patient acknowledged the explanation, agreed to proceed with the plan and consent was signed. Patient is being admitted for inpatient treatment for surgery, pain control, PT, OT, prophylactic antibiotics, VTE prophylaxis, progressive ambulation and ADL's and discharge planning. The patient is planning to be discharged home with home health services    Anticipated LOS equal to or greater than 2 midnights due to - Age 64 and older with one or more of the following:  - Obesity  - Expected need for hospital services (PT, OT, Nursing) required for safe  discharge  - Anticipated need for postoperative skilled nursing care or inpatient rehab  - Active co-morbidities: Cardiac Arrhythmia OR   - Unanticipated findings during/Post Surgery: None  - Patient is a high risk of re-admission due to: None

## 2021-11-24 NOTE — H&P (Signed)
TOTAL KNEE ADMISSION H&P  Patient is being admitted for right total knee arthroplasty.  Subjective:  Chief Complaint:right knee pain.  HPI: Shirley Cameron, 50 y.o. female, has a history of pain and functional disability in the right knee due to trauma and has failed non-surgical conservative treatments for greater than 12 weeks to includeNSAID's and/or analgesics, corticosteriod injections, and activity modification.  Onset of symptoms was gradual, starting 5 years ago with gradually worsening course since that time. The patient noted prior procedures on the knee to include  high tibial medial osteotomy, closing wedge for varus producing an MCL reconstruction.  Progressively worsening pain over the years.  Status post hardware removal for osteomyelitis in 2020 with Dr. Griffin Basil.  Treated with several months of IV antibiotics.  Following underwent a right knee arthroscopy, chondroplasty and partial lateral meniscectomy in October of 2020.  on the right knee(s).  Patient currently rates pain in the right knee(s) at 10 out of 10 with activity. Patient has night pain, worsening of pain with activity and weight bearing, pain that interferes with activities of daily living, pain with passive range of motion, crepitus, and joint swelling.  Patient has evidence of periarticular osteophytes and joint space narrowing by imaging studies. This patient has had failure of previous osteotomy. There is no active infection.  There are no problems to display for this patient.  Past Medical History:  Diagnosis Date   Anemia    Anxiety    Arthritis    Asthma    Cancer ([place])    oral and cervical   Complication of anesthesia    very slow/ hard to wake up and usually has itching   Depression    Elevated liver enzymes    GERD (gastroesophageal reflux disease)    Headache    Hypertension    Pneumonia    PTSD (post-traumatic stress disorder)     Past Surgical History:  Procedure Laterality Date    ABDOMINAL HYSTERECTOMY     ARTHROSCOPIC REPAIR ACL     BREAST SURGERY     CHOLECYSTECTOMY     CHONDROPLASTY Right 10/18/2019   Procedure: CHONDROPLASTY;  Surgeon: Hiram Gash, MD;  Location: [facility];  Service: Orthopedics;  Laterality: Right;   EXPLORATORY LAPAROTOMY     FRACTURE SURGERY     ankle   GASTRIC ROUX-EN-Y  12/19/2008   HARDWARE REMOVAL     KNEE ARTHROSCOPY WITH LATERAL MENISECTOMY Right 10/18/2019   Procedure: KNEE ARTHROSCOPY WITH LATERAL MENISECTOMY;  Surgeon: Hiram Gash, MD;  Location: [place];  Service: Orthopedics;  Laterality: Right;   OSTEOTOMY AND TIBIAL SHORTENING     TONSILLECTOMY     WISDOM TOOTH EXTRACTION      Current Outpatient Medications  Medication Sig Dispense Refill Last Dose   hydrOXYzine (ATARAX) 50 MG tablet Take 50 mg by mouth 3 (three) times daily as needed for itching.      lisinopril (ZESTRIL) 10 MG tablet Take 10 mg by mouth daily.      ondansetron (ZOFRAN) 8 MG tablet Take 8 mg by mouth every 8 (eight) hours as needed for nausea or vomiting.      pantoprazole (PROTONIX) 40 MG tablet Take 40 mg by mouth daily.      promethazine (PHENERGAN) 25 MG tablet Take 25 mg by mouth every 6 (six) hours as needed for nausea or vomiting.      propranolol (INDERAL) 80 MG tablet Take 120 mg by mouth 2 (two) times daily.  tiZANidine (ZANAFLEX) 4 MG tablet Take 4-8 mg by mouth at bedtime.      No current facility-administered medications for this visit.   Allergies  Allergen Reactions   Nsaids     H/o gastric bypass   Penicillins Hives   Cephalexin Itching and Rash   Sulfamethoxazole-Trimethoprim Itching and Rash    Social History   Tobacco Use   Smoking status: Never   Smokeless tobacco: Never  Substance Use Topics   Alcohol use: Yes    Comment: rare    No family history on file.   Review of Systems  Cardiovascular:  Positive for chest pain and palpitations.  Gastrointestinal:  Positive for  constipation, diarrhea, nausea and vomiting.  Musculoskeletal:  Positive for arthralgias.  Skin:  Positive for rash.  Neurological:  Positive for headaches.  Hematological:  Bruises/bleeds easily.  Psychiatric/Behavioral:  The patient is nervous/anxious.   All other systems reviewed and are negative.  Objective:  Physical Exam Constitutional:      General: She is not in acute distress.    Appearance: Normal appearance.  HENT:     Head: Normocephalic and atraumatic.  Eyes:     Extraocular Movements: Extraocular movements intact.     Pupils: Pupils are equal, round, and reactive to light.  Cardiovascular:     Rate and Rhythm: Tachycardia present.     Pulses: Normal pulses.     Heart sounds: Normal heart sounds.  Pulmonary:     Effort: Pulmonary effort is normal. No respiratory distress.     Breath sounds: Normal breath sounds.  Abdominal:     General: Abdomen is flat. Bowel sounds are normal. There is no distension.     Palpations: Abdomen is soft.     Tenderness: There is no abdominal tenderness.  Musculoskeletal:     Cervical back: Normal range of motion and neck supple.     Comments: Examination of the right lower extremity again shows she is neurovascularly intact.  Well healed surgical incisions.  She has intact dorsiflexion and plantarflexion with the ankle.  There is no calf tenderness to palpation.    Lymphadenopathy:     Cervical: No cervical adenopathy.  Skin:    General: Skin is warm and dry.     Coloration: Skin is not jaundiced.     Findings: No erythema or rash.  Neurological:     General: No focal deficit present.     Mental Status: She is alert and oriented to person, place, and time.  Psychiatric:        Mood and Affect: Mood normal.        Behavior: Behavior normal.    Vital signs in last 24 hours: @VSRANGES @  Labs:   Estimated body mass index is 36.79 kg/m as calculated from the following:   Height as of an earlier encounter on 11/24/21: 5\' 6"   (1.676 m).   Weight as of 10/18/19: 103.4 kg.   Imaging Review Plain radiographs demonstrate severe degenerative joint disease of the right knee(s). The overall alignment issignificant varus. The bone quality appears to be good for age and reported activity level.      Assessment/Plan:  End stage arthritis, right knee   The patient history, physical examination, clinical judgment of the provider and imaging studies are consistent with end stage degenerative joint disease of the right knee(s) and total knee arthroplasty is deemed medically necessary. The treatment options including medical management, injection therapy arthroscopy and arthroplasty were discussed at length. The risks and benefits  of total knee arthroplasty were presented and reviewed. The risks due to aseptic loosening, infection, stiffness, patella tracking problems, thromboembolic complications and other imponderables were discussed. The patient acknowledged the explanation, agreed to proceed with the plan and consent was signed. Patient is being admitted for inpatient treatment for surgery, pain control, PT, OT, prophylactic antibiotics, VTE prophylaxis, progressive ambulation and ADL's and discharge planning. The patient is planning to be discharged home with home health services    Anticipated LOS equal to or greater than 2 midnights due to - Age 76 and older with one or more of the following:  - Obesity  - Expected need for hospital services (PT, OT, Nursing) required for safe  discharge  - Anticipated need for postoperative skilled nursing care or inpatient rehab  - Active co-morbidities: Cardiac Arrhythmia OR   - Unanticipated findings during/Post Surgery: None  - Patient is a high risk of re-admission due to: None

## 2021-12-03 ENCOUNTER — Other Ambulatory Visit: Payer: Self-pay | Admitting: Orthopedic Surgery

## 2021-12-03 LAB — SARS CORONAVIRUS 2 (TAT 6-24 HRS): SARS Coronavirus 2: NEGATIVE

## 2021-12-06 NOTE — Anesthesia Preprocedure Evaluation (Addendum)
Anesthesia Evaluation  Patient identified by MRN, date of birth, ID band  Reviewed: Allergy & Precautions, NPO status , Patient's Chart, lab work & pertinent test results  Airway Mallampati: I  TM Distance: >3 FB Neck ROM: Full    Dental no notable dental hx. (+) Edentulous Lower, Edentulous Upper   Pulmonary    Pulmonary exam normal breath sounds clear to auscultation       Cardiovascular hypertension, Pt. on medications Normal cardiovascular exam Rhythm:Regular Rate:Normal     Neuro/Psych Anxiety Depression    GI/Hepatic Neg liver ROS, GERD  Medicated and Controlled,  Endo/Other  negative endocrine ROS  Renal/GU Lab Results      Component                Value               Date                      CREATININE               0.65                11/24/2021                BUN                      11                  11/24/2021                NA                       136                 11/24/2021                K                        3.9                 11/24/2021                CL                       104                 11/24/2021                CO2                      25                  11/24/2021                Musculoskeletal  (+) Arthritis , Osteoarthritis,    Abdominal (+) + obese,   Peds  Hematology Lab Results      Component                Value               Date                      WBC                      5.1  11/24/2021                HGB                      10.6 (L)            11/24/2021                HCT                      33.3 (L)            11/24/2021                MCV                      81.2                11/24/2021                PLT                      323                 11/24/2021              Anesthesia Other Findings ALL: PCN Cephalexin, sulfa  Reproductive/Obstetrics negative OB ROS                         Anesthesia Physical Anesthesia  Plan  ASA: 3  Anesthesia Plan: Spinal and Regional   Post-op Pain Management: Regional block and Dilaudid IV   Induction: Intravenous  PONV Risk Score and Plan: 3 and Treatment may vary due to age or medical condition, Midazolam and Ondansetron  Airway Management Planned: Nasal Cannula and Natural Airway  Additional Equipment: None  Intra-op Plan:   Post-operative Plan:   Informed Consent: I have reviewed the patients History and Physical, chart, labs and discussed the procedure including the risks, benefits and alternatives for the proposed anesthesia with the patient or authorized representative who has indicated his/her understanding and acceptance.     Dental advisory given  Plan Discussed with: CRNA  Anesthesia Plan Comments: (SP w R adductor Canal bock)       Anesthesia Quick Evaluation

## 2021-12-07 ENCOUNTER — Ambulatory Visit (HOSPITAL_COMMUNITY): Payer: BC Managed Care – PPO | Admitting: Physician Assistant

## 2021-12-07 ENCOUNTER — Ambulatory Visit (HOSPITAL_COMMUNITY): Payer: BC Managed Care – PPO

## 2021-12-07 ENCOUNTER — Ambulatory Visit (HOSPITAL_COMMUNITY)
Admission: RE | Admit: 2021-12-07 | Discharge: 2021-12-07 | Disposition: A | Discharge status: Home / Self Care | Payer: BC Managed Care – PPO | Source: Ambulatory Visit | Attending: Orthopedic Surgery | Admitting: Orthopedic Surgery

## 2021-12-07 ENCOUNTER — Encounter (HOSPITAL_COMMUNITY): Payer: Self-pay | Admitting: Orthopedic Surgery

## 2021-12-07 ENCOUNTER — Ambulatory Visit (HOSPITAL_COMMUNITY): Payer: BC Managed Care – PPO | Admitting: Anesthesiology

## 2021-12-07 ENCOUNTER — Encounter (HOSPITAL_COMMUNITY)
Admission: RE | Disposition: A | Discharge status: Home / Self Care | Payer: Self-pay | Source: Ambulatory Visit | Attending: Orthopedic Surgery

## 2021-12-07 DIAGNOSIS — M6281 Muscle weakness (generalized): Secondary | ICD-10-CM | POA: Diagnosis not present

## 2021-12-07 DIAGNOSIS — M1731 Unilateral post-traumatic osteoarthritis, right knee: Secondary | ICD-10-CM | POA: Diagnosis not present

## 2021-12-07 DIAGNOSIS — Z9889 Other specified postprocedural states: Secondary | ICD-10-CM | POA: Diagnosis not present

## 2021-12-07 DIAGNOSIS — R2689 Other abnormalities of gait and mobility: Secondary | ICD-10-CM | POA: Diagnosis not present

## 2021-12-07 DIAGNOSIS — Z96651 Presence of right artificial knee joint: Secondary | ICD-10-CM | POA: Diagnosis not present

## 2021-12-07 DIAGNOSIS — G8918 Other acute postprocedural pain: Secondary | ICD-10-CM | POA: Diagnosis not present

## 2021-12-07 DIAGNOSIS — M1711 Unilateral primary osteoarthritis, right knee: Secondary | ICD-10-CM | POA: Diagnosis present

## 2021-12-07 DIAGNOSIS — Z471 Aftercare following joint replacement surgery: Secondary | ICD-10-CM | POA: Diagnosis not present

## 2021-12-07 DIAGNOSIS — M21161 Varus deformity, not elsewhere classified, right knee: Secondary | ICD-10-CM | POA: Diagnosis not present

## 2021-12-07 DIAGNOSIS — K219 Gastro-esophageal reflux disease without esophagitis: Secondary | ICD-10-CM | POA: Diagnosis not present

## 2021-12-07 HISTORY — PX: TOTAL KNEE ARTHROPLASTY: SHX125

## 2021-12-07 SURGERY — ARTHROPLASTY, KNEE, TOTAL
Anesthesia: Regional | Site: Knee | Laterality: Right

## 2021-12-07 MED ORDER — HYDROMORPHONE HCL 1 MG/ML IJ SOLN: 0.2500 mg | INTRAMUSCULAR | Status: DC | PRN

## 2021-12-07 MED ORDER — SODIUM CHLORIDE 0.9 % IR SOLN
Status: DC | PRN
  Administered 2021-12-07: 09:00:00 1000 mL

## 2021-12-07 MED ORDER — ONDANSETRON HCL 4 MG/2ML IJ SOLN: 4.0000 mg | Freq: Once | INTRAMUSCULAR | Status: DC | PRN

## 2021-12-07 MED ORDER — TRANEXAMIC ACID-NACL 1000-0.7 MG/100ML-% IV SOLN
1000.0000 mg | INTRAVENOUS | Status: AC
  Administered 2021-12-07: 08:00:00 1000 mg via INTRAVENOUS
  Filled 2021-12-07: qty 100

## 2021-12-07 MED ORDER — PROPOFOL 500 MG/50ML IV EMUL: INTRAVENOUS | Status: DC | PRN

## 2021-12-07 MED ORDER — CEFAZOLIN SODIUM-DEXTROSE 2-4 GM/100ML-% IV SOLN
INTRAVENOUS | Status: AC
  Filled 2021-12-07: qty 100

## 2021-12-07 MED ORDER — ONDANSETRON HCL 4 MG PO TABS: 4.0000 mg | ORAL_TABLET | Freq: Three times a day (TID) | ORAL | 0 refills | Status: AC | PRN

## 2021-12-07 MED ORDER — SODIUM CHLORIDE 0.9 % IR SOLN
Status: DC | PRN
  Administered 2021-12-07: 3000 mL

## 2021-12-07 MED ORDER — PROPOFOL 10 MG/ML IV BOLUS
INTRAVENOUS | Status: DC | PRN
  Administered 2021-12-07 (×2): 40 mg via INTRAVENOUS

## 2021-12-07 MED ORDER — HYDROMORPHONE HCL 1 MG/ML IJ SOLN: 0.5000 mg | INTRAMUSCULAR | Status: DC | PRN

## 2021-12-07 MED ORDER — APIXABAN 2.5 MG PO TABS: 2.5000 mg | ORAL_TABLET | Freq: Two times a day (BID) | ORAL | 0 refills | Status: DC

## 2021-12-07 MED ORDER — ACETAMINOPHEN 10 MG/ML IV SOLN: 1000.0000 mg | Freq: Once | INTRAVENOUS | Status: DC | PRN

## 2021-12-07 MED ORDER — CEFADROXIL 500 MG PO CAPS: 500.0000 mg | ORAL_CAPSULE | Freq: Two times a day (BID) | ORAL | 0 refills | Status: AC

## 2021-12-07 MED ORDER — ACETAMINOPHEN 500 MG PO TABS: 1000.0000 mg | ORAL_TABLET | Freq: Three times a day (TID) | ORAL | 0 refills | Status: AC | PRN

## 2021-12-07 MED ORDER — OXYCODONE HCL 5 MG PO TABS
ORAL_TABLET | ORAL | Status: AC
  Filled 2021-12-07: qty 2

## 2021-12-07 MED ORDER — LACTATED RINGERS IV SOLN: INTRAVENOUS | Status: DC

## 2021-12-07 MED ORDER — ROPIVACAINE HCL 5 MG/ML IJ SOLN
INTRAMUSCULAR | Status: DC | PRN
  Administered 2021-12-07: 150 mg via PERINEURAL

## 2021-12-07 MED ORDER — CHLORHEXIDINE GLUCONATE 0.12 % MT SOLN
15.0000 mL | Freq: Once | OROMUCOSAL | Status: AC
  Administered 2021-12-07: 06:00:00 15 mL via OROMUCOSAL

## 2021-12-07 MED ORDER — MIDAZOLAM HCL 2 MG/2ML IJ SOLN
INTRAMUSCULAR | Status: AC
  Filled 2021-12-07: qty 2

## 2021-12-07 MED ORDER — 0.9 % SODIUM CHLORIDE (POUR BTL) OPTIME
TOPICAL | Status: DC | PRN
  Administered 2021-12-07: 08:00:00 500 mL

## 2021-12-07 MED ORDER — DEXAMETHASONE SODIUM PHOSPHATE 10 MG/ML IJ SOLN: 8.0000 mg | Freq: Once | INTRAMUSCULAR | Status: DC

## 2021-12-07 MED ORDER — OXYCODONE HCL 5 MG PO TABS: 5.0000 mg | ORAL_TABLET | ORAL | Status: DC | PRN

## 2021-12-07 MED ORDER — GLYCOPYRROLATE PF 0.2 MG/ML IJ SOSY
PREFILLED_SYRINGE | INTRAMUSCULAR | Status: DC | PRN
  Administered 2021-12-07: .2 mg via INTRAVENOUS

## 2021-12-07 MED ORDER — OXYCODONE HCL 5 MG PO TABS: 5.0000 mg | ORAL_TABLET | ORAL | 0 refills | Status: AC | PRN

## 2021-12-07 MED ORDER — SODIUM CHLORIDE (PF) 0.9 % IJ SOLN
INTRAMUSCULAR | Status: AC
  Filled 2021-12-07: qty 10

## 2021-12-07 MED ORDER — POVIDONE-IODINE 10 % EX SWAB
2.0000 "application " | Freq: Once | CUTANEOUS | Status: AC
  Administered 2021-12-07: 2 via TOPICAL

## 2021-12-07 MED ORDER — OXYCODONE HCL 5 MG PO TABS: 5.0000 mg | ORAL_TABLET | Freq: Once | ORAL | Status: DC | PRN

## 2021-12-07 MED ORDER — ORAL CARE MOUTH RINSE: 15.0000 mL | Freq: Once | OROMUCOSAL | Status: AC

## 2021-12-07 MED ORDER — PROPOFOL 500 MG/50ML IV EMUL
INTRAVENOUS | Status: DC | PRN
  Administered 2021-12-07: 40 ug/kg/min via INTRAVENOUS

## 2021-12-07 MED ORDER — MIDAZOLAM HCL 2 MG/2ML IJ SOLN
INTRAMUSCULAR | Status: DC | PRN
Start: 2021-12-07 — End: 2021-12-07
  Administered 2021-12-07 (×2): 1 mg via INTRAVENOUS
  Administered 2021-12-07: 2 mg via INTRAVENOUS

## 2021-12-07 MED ORDER — PHENYLEPHRINE HCL (PRESSORS) 10 MG/ML IV SOLN
INTRAVENOUS | Status: AC
  Filled 2021-12-07: qty 1

## 2021-12-07 MED ORDER — METHOCARBAMOL 500 MG PO TABS: 500.0000 mg | ORAL_TABLET | Freq: Three times a day (TID) | ORAL | 0 refills | Status: AC | PRN

## 2021-12-07 MED ORDER — BUPIVACAINE LIPOSOME 1.3 % IJ SUSP
INTRAMUSCULAR | Status: AC
  Filled 2021-12-07: qty 20

## 2021-12-07 MED ORDER — DEXAMETHASONE SODIUM PHOSPHATE 10 MG/ML IJ SOLN
INTRAMUSCULAR | Status: DC | PRN
  Administered 2021-12-07: 8 mg via INTRAVENOUS

## 2021-12-07 MED ORDER — SODIUM CHLORIDE 0.9% FLUSH
INTRAVENOUS | Status: DC | PRN
  Administered 2021-12-07: 60 mL

## 2021-12-07 MED ORDER — CEFAZOLIN SODIUM-DEXTROSE 2-4 GM/100ML-% IV SOLN
2.0000 g | Freq: Four times a day (QID) | INTRAVENOUS | Status: DC
  Administered 2021-12-07: 13:00:00 2 g via INTRAVENOUS

## 2021-12-07 MED ORDER — CEFAZOLIN SODIUM-DEXTROSE 2-4 GM/100ML-% IV SOLN
2.0000 g | INTRAVENOUS | Status: AC
  Administered 2021-12-07: 07:00:00 2 g via INTRAVENOUS

## 2021-12-07 MED ORDER — AMISULPRIDE (ANTIEMETIC) 5 MG/2ML IV SOLN: 10.0000 mg | Freq: Once | INTRAVENOUS | Status: DC | PRN

## 2021-12-07 MED ORDER — BUPIVACAINE IN DEXTROSE 0.75-8.25 % IT SOLN
INTRATHECAL | Status: DC | PRN
  Administered 2021-12-07: 13.5 mg via INTRATHECAL

## 2021-12-07 MED ORDER — OXYCODONE HCL 5 MG/5ML PO SOLN: 5.0000 mg | Freq: Once | ORAL | Status: DC | PRN

## 2021-12-07 MED ORDER — ONDANSETRON HCL 4 MG/2ML IJ SOLN
INTRAMUSCULAR | Status: AC
  Filled 2021-12-07: qty 2

## 2021-12-07 MED ORDER — ACETAMINOPHEN 500 MG PO TABS: 1000.0000 mg | ORAL_TABLET | Freq: Four times a day (QID) | ORAL | Status: DC

## 2021-12-07 MED ORDER — ONDANSETRON HCL 4 MG/2ML IJ SOLN
INTRAMUSCULAR | Status: DC | PRN
  Administered 2021-12-07: 4 mg via INTRAVENOUS

## 2021-12-07 MED ORDER — METOCLOPRAMIDE HCL 5 MG PO TABS: 5.0000 mg | ORAL_TABLET | Freq: Three times a day (TID) | ORAL | Status: DC | PRN

## 2021-12-07 MED ORDER — METOCLOPRAMIDE HCL 5 MG/ML IJ SOLN: 5.0000 mg | Freq: Three times a day (TID) | INTRAMUSCULAR | Status: DC | PRN

## 2021-12-07 MED ORDER — CLONIDINE HCL (ANALGESIA) 100 MCG/ML EP SOLN
EPIDURAL | Status: DC | PRN
  Administered 2021-12-07: 100 ug

## 2021-12-07 MED ORDER — POVIDONE-IODINE 10 % EX SWAB: 2.0000 "application " | Freq: Once | CUTANEOUS | Status: DC

## 2021-12-07 MED ORDER — FENTANYL CITRATE (PF) 100 MCG/2ML IJ SOLN
INTRAMUSCULAR | Status: AC
  Filled 2021-12-07: qty 2

## 2021-12-07 MED ORDER — PHENYLEPHRINE HCL-NACL 20-0.9 MG/250ML-% IV SOLN
INTRAVENOUS | Status: DC | PRN
  Administered 2021-12-07: 70 ug/min via INTRAVENOUS

## 2021-12-07 MED ORDER — WATER FOR IRRIGATION, STERILE IR SOLN
Status: DC | PRN
  Administered 2021-12-07: 2000 mL

## 2021-12-07 MED ORDER — OXYCODONE HCL 5 MG PO TABS
10.0000 mg | ORAL_TABLET | ORAL | Status: DC | PRN
  Administered 2021-12-07: 12:00:00 10 mg via ORAL

## 2021-12-07 MED ORDER — BUPIVACAINE LIPOSOME 1.3 % IJ SUSP
INTRAMUSCULAR | Status: DC | PRN
  Administered 2021-12-07: 20 mL

## 2021-12-07 MED ORDER — DEXAMETHASONE SODIUM PHOSPHATE 10 MG/ML IJ SOLN
INTRAMUSCULAR | Status: AC
  Filled 2021-12-07: qty 1

## 2021-12-07 MED ORDER — FENTANYL CITRATE (PF) 100 MCG/2ML IJ SOLN
INTRAMUSCULAR | Status: DC | PRN
  Administered 2021-12-07: 100 ug via INTRAVENOUS

## 2021-12-07 SURGICAL SUPPLY — 61 items
BAG COUNTER SPONGE SURGICOUNT (BAG) ×1 IMPLANT
BLADE HEX COATED 2.75 (ELECTRODE) ×2 IMPLANT
BLADE SAG 18X100X1.27 (BLADE) ×2 IMPLANT
BLADE SAW SAG 35X64 .89 (BLADE) ×2 IMPLANT
BNDG ELASTIC 6X10 VLCR STRL LF (GAUZE/BANDAGES/DRESSINGS) ×2 IMPLANT
BOWL SMART MIX CTS (DISPOSABLE) ×1 IMPLANT
CEMENT BONE REFOBACIN R1X40 US (Cement) ×2 IMPLANT
CHLORAPREP W/TINT 26 (MISCELLANEOUS) ×4 IMPLANT
COMP FEM CMT PERSONA SZ7 RT (Joint) ×2 IMPLANT
COMPONENT FEM CMT PRSONA SZ7RT (Joint) IMPLANT
COVER SURGICAL LIGHT HANDLE (MISCELLANEOUS) ×2 IMPLANT
CUFF TOURN SGL QUICK 34 (TOURNIQUET CUFF) ×1
CUFF TRNQT CYL 34X4.125X (TOURNIQUET CUFF) ×1 IMPLANT
DECANTER SPIKE VIAL GLASS SM (MISCELLANEOUS) ×1 IMPLANT
DERMABOND ADVANCED (GAUZE/BANDAGES/DRESSINGS) ×1
DERMABOND ADVANCED .7 DNX12 (GAUZE/BANDAGES/DRESSINGS) ×1 IMPLANT
DRAPE INCISE IOBAN 66X45 STRL (DRAPES) ×1 IMPLANT
DRAPE INCISE IOBAN 85X60 (DRAPES) ×2 IMPLANT
DRAPE SHEET LG 3/4 BI-LAMINATE (DRAPES) ×2 IMPLANT
DRAPE U-SHAPE 47X51 STRL (DRAPES) ×2 IMPLANT
DRESSING AQUACEL AG SP 3.5X10 (GAUZE/BANDAGES/DRESSINGS) ×1 IMPLANT
DRSG AQUACEL AG SP 3.5X10 (GAUZE/BANDAGES/DRESSINGS) ×2
GLOVE SRG 8 PF TXTR STRL LF DI (GLOVE) ×1 IMPLANT
GLOVE SURG ENC MOIS LTX SZ8 (GLOVE) ×4 IMPLANT
GLOVE SURG UNDER POLY LF SZ8 (GLOVE) ×1
GOWN STRL REUS W/TWL XL LVL3 (GOWN DISPOSABLE) ×2 IMPLANT
HANDPIECE INTERPULSE COAX TIP (DISPOSABLE) ×1
HDLS TROCR DRIL PIN KNEE 75 (PIN) ×3
HOLDER FOLEY CATH W/STRAP (MISCELLANEOUS) IMPLANT
HOOD PEEL AWAY FLYTE STAYCOOL (MISCELLANEOUS) ×6 IMPLANT
INSERT TIB ARTISURF SZ6-7 (Insert) ×1 IMPLANT
IRRIGATION SURGIPHOR STRL (IV SOLUTION) ×2 IMPLANT
MANIFOLD NEPTUNE II (INSTRUMENTS) ×2 IMPLANT
MARKER SKIN DUAL TIP RULER LAB (MISCELLANEOUS) ×4 IMPLANT
NS IRRIG 1000ML POUR BTL (IV SOLUTION) ×2 IMPLANT
PACK TOTAL KNEE CUSTOM (KITS) ×2 IMPLANT
PIN DRILL HDLS TROCAR 75 4PK (PIN) IMPLANT
PROTECTOR NERVE ULNAR (MISCELLANEOUS) ×2 IMPLANT
SCREW HEADED 33MM KNEE (MISCELLANEOUS) ×2 IMPLANT
SET HNDPC FAN SPRY TIP SCT (DISPOSABLE) ×1 IMPLANT
SET PIN GUIDE PS PAT SPEC (INSTRUMENTS) ×1 IMPLANT
SPONGE T-LAP 18X18 ~~LOC~~+RFID (SPONGE) ×2 IMPLANT
STEM POLY PAT PLY 32M KNEE (Knees) ×1 IMPLANT
STEM TIB ST PERS 14+30 (Stem) ×1 IMPLANT
STEM TIBIA 5 DEG SZ E R KNEE (Knees) IMPLANT
SUT MNCRL AB 3-0 PS2 18 (SUTURE) ×2 IMPLANT
SUT STRATAFIX 0 PDS 27 VIOLET (SUTURE) ×2
SUT STRATAFIX PDO 1 14 VIOLET (SUTURE) ×1
SUT STRATFX PDO 1 14 VIOLET (SUTURE) ×1
SUT VIC AB 0 CT1 36 (SUTURE) ×1 IMPLANT
SUT VIC AB 2-0 CT2 27 (SUTURE) ×6 IMPLANT
SUTURE STRATFX 0 PDS 27 VIOLET (SUTURE) ×1 IMPLANT
SUTURE STRATFX PDO 1 14 VIOLET (SUTURE) ×1 IMPLANT
SYR 10ML LL (SYRINGE) ×1 IMPLANT
SYR 50ML LL SCALE MARK (SYRINGE) ×2 IMPLANT
TIBIA STEM 5 DEG SZ E R KNEE (Knees) ×2 IMPLANT
TRAY FOLEY MTR SLVR 14FR STAT (SET/KITS/TRAYS/PACK) ×1 IMPLANT
TRAY FOLEY MTR SLVR 16FR STAT (SET/KITS/TRAYS/PACK) IMPLANT
TUBE SUCTION HIGH CAP CLEAR NV (SUCTIONS) ×2 IMPLANT
WRAP KNEE MAXI GEL POST OP (GAUZE/BANDAGES/DRESSINGS) ×1 IMPLANT
headed screw 33mm length (Screw) ×2 IMPLANT

## 2021-12-07 NOTE — Anesthesia Procedure Notes (Signed)
Anesthesia Regional Block: Adductor canal block   Pre-Anesthetic Checklist: , timeout performed,  Correct Patient, Correct Site, Correct Laterality,  Correct Procedure, Correct Position, site marked,  Risks and benefits discussed,  Surgical consent,  Pre-op evaluation,  At surgeon's request and post-op pain management  Laterality: Lower and Right  Prep: chloraprep       Needles:  Injection technique: Single-shot  Needle Type: Echogenic Needle     Needle Length: 9cm  Needle Gauge: 22     Additional Needles:   Procedures:,,,, ultrasound used (permanent image in chart),,    Narrative:  Start time: 12/07/2021 6:58 AM End time: 12/07/2021 7:06 AM Injection made incrementally with aspirations every 5 mL.  Performed by: Personally  Anesthesiologist: Barnet Glasgow, MD  Additional Notes: Block assessed prior to surgery. Pt tolerated procedure well.

## 2021-12-07 NOTE — Anesthesia Postprocedure Evaluation (Signed)
Anesthesia Post Note  Patient: Shirley Cameron  Procedure(s) Performed: TOTAL KNEE ARTHROPLASTY (Right: Knee)     Patient location during evaluation: Nursing Unit Anesthesia Type: Regional and Spinal Level of consciousness: oriented and awake and alert Pain management: pain level controlled Vital Signs Assessment: post-procedure vital signs reviewed and stable Respiratory status: spontaneous breathing and respiratory function stable Cardiovascular status: blood pressure returned to baseline and stable Postop Assessment: no headache, no backache, no apparent nausea or vomiting and patient able to bend at knees Anesthetic complications: no   No notable events documented.  Last Vitals:  Vitals:   12/07/21 1209 12/07/21 1309  BP: 123/89 122/88  Pulse: 78 77  Resp: 15 16  Temp:    SpO2: 97% 97%    Last Pain:  Vitals:   12/07/21 1309  TempSrc:   PainSc: 2                  Barnet Glasgow

## 2021-12-07 NOTE — Interval H&P Note (Signed)
The patient has been re-examined, and the chart reviewed, and there have been no interval changes to the documented history and physical.    The risks benefits and alternatives were discussed with the patient including but not limited to the risks of nonoperative treatment, versus surgical intervention including infection, bleeding, nerve injury, the need for revision surgery, hardware prominence, hardware failure, the need for hardware removal, blood clots, cardiopulmonary complications, morbidity, mortality, among others, and they were willing to proceed.  Consent was signed by myself and the patient.  Right knee was marked.

## 2021-12-07 NOTE — Evaluation (Signed)
Physical Therapy Evaluation Patient Details Name: Shirley Cameron MRN: [id number] DOB: 1971/05/03 Today's Date: 12/07/2021  History of Present Illness  Pt is 50 yo female s/p R TKA on 12/07/21.  Pt with hx including anxiety, oral and cervical CA, HTN, PTSD, GERD, Pt reports 4 prior surgeries on R knee including osteotomy.  Clinical Impression  Pt is s/p TKA resulting in the deficits listed below (see PT Problem List). PT asked to see pt for possible same day d/c.  At baseline, pt is independent, works, and does not use AD.  Today, pt very motivated and eager to go home.  She had excellent ROM 0 to 85 degrees, quad activation, and pain control (reported 7/10, but tolerated well and reports that is her baseline pain level).  She was able to ambulate 100' with RW and performed steps similar to home set up.   Pt demonstrates safe gait & transfers in order to return home from PT perspective once discharged by MD.  While in hospital, will continue to benefit from PT for skilled therapy to advance mobility and exercises.           Recommendations for follow up therapy are one component of a multi-disciplinary discharge planning process, led by the attending physician.  Recommendations may be updated based on patient status, additional functional criteria and insurance authorization.  Follow Up Recommendations Follow physician's recommendations for discharge plan and follow up therapies    Assistance Recommended at Discharge Intermittent Supervision/Assistance  Functional Status Assessment Patient has had a recent decline in their functional status and demonstrates the ability to make significant improvements in function in a reasonable and predictable amount of time.  Equipment Recommendations  None recommended by PT    Recommendations for Other Services       Precautions / Restrictions Precautions Precautions: None Restrictions Weight Bearing Restrictions: Yes RLE Weight Bearing: Weight  bearing as tolerated      Mobility  Bed Mobility Overal bed mobility: Needs Assistance Bed Mobility: Supine to Sit;Sit to Supine     Supine to sit: Supervision Sit to supine: Supervision        Transfers Overall transfer level: Needs assistance Equipment used: Rolling walker (2 wheels) Transfers: Sit to/from Stand Sit to Stand: Min guard           General transfer comment: From bed and toilet; cues for hand placement and R LE management    Ambulation/Gait Ambulation/Gait assistance: Min guard;Supervision Gait Distance (Feet): 100 Feet Assistive device: Rolling walker (2 wheels) Gait Pattern/deviations: Step-through pattern;Decreased stride length;Decreased weight shift to right Gait velocity: slight decrease     General Gait Details: Min guard progressing to supervision; also progressed from step to R gait to step through; cues for pushing RW and sequencing intially  Stairs Stairs: Yes Stairs assistance: Min guard Stair Management: Two rails;Step to pattern;Forwards Number of Stairs: 5 General stair comments: up/down steps without difficulty - educated on sequencing; also discussed how to do single step into living room with RW  Wheelchair Mobility    Modified Rankin (Stroke Patients Only)       Balance Overall balance assessment: Needs assistance Sitting-balance support: No upper extremity supported Sitting balance-Leahy Scale: Normal     Standing balance support: No upper extremity supported;Bilateral upper extremity supported Standing balance-Leahy Scale: Fair Standing balance comment: RW to ambulate but could static stand for ADLs without support  Pertinent Vitals/Pain Pain Assessment: 0-10 Pain Score: 7  Pain Location: R knee Pain Descriptors / Indicators: Discomfort Pain Intervention(s): Limited activity within patient's tolerance;Premedicated before session;Monitored during session    [place] expects to be discharged to:: Private residence Living Arrangements: Spouse/significant other Available Help at Discharge: Family;Available 24 hours/day Type of Home: House Home Access: Stairs to enter Entrance Stairs-Rails: Psychiatric nurse of Steps: 2   Home Layout: One level (1 step to suken living room) Home Equipment: Conservation officer, nature (2 wheels);Cane - single point;Crutches      Prior Function Prior Level of Function : Independent/Modified Independent;Working/employed;Driving             Mobility Comments: Pt could ambulate in community ADLs Comments: Independent and working     Journalist, newspaper        Extremity/Trunk Assessment   Upper Extremity Assessment Upper Extremity Assessment: Overall WFL for tasks assessed    Lower Extremity Assessment Lower Extremity Assessment: LLE deficits/detail;RLE deficits/detail RLE Deficits / Details: Expected post op changes; ROM: ankle and hip WFL, knee 0 to85 degrees; MMT: ankle 5/5, knee and hip 3/5 LLE Deficits / Details: ROM WFL; MMT 5/5    Cervical / Trunk Assessment Cervical / Trunk Assessment: Normal  Communication   Communication: No difficulties  Cognition Arousal/Alertness: Awake/alert Behavior During Therapy: WFL for tasks assessed/performed Overall Cognitive Status: Within Functional Limits for tasks assessed                                          General Comments  Educated on safe ice use, no pivots, car transfers, resting with leg straight, and TED hose during day. Also, encouraged walking every 1-2 hours during day. Educated on HEP with focus on mobility the first weeks. Discussed doing exercises within pain control and if pain increasing could decreased ROM, reps, and stop exercises as needed. Encouraged to perform quad sets and ankle pumps frequently for blood flow and to promote full knee extension.     Exercises Total Joint Exercises Ankle Circles/Pumps:  AROM;Both;10 reps;Supine Quad Sets: AROM;10 reps;Both;Supine Towel Squeeze: AROM;Both;10 reps;Supine Heel Slides: AROM;Right;10 reps;Supine Hip ABduction/ADduction: AROM;Right;10 reps;Supine Long Arc Quad: AROM;Right;10 reps;Seated Knee Flexion: AROM;Right;10 reps;Seated Goniometric ROM: R knee 0 to 85 degrees Other Exercises Other Exercises: Pt educated on AAROM techniques if needed and correct exercise form   Assessment/Plan    PT Assessment Patient needs continued PT services  PT Problem List Decreased strength;Decreased mobility;Decreased range of motion;Decreased activity tolerance;Decreased balance;Decreased knowledge of use of DME;Pain       PT Treatment Interventions DME instruction;Therapeutic activities;Modalities;Gait training;Therapeutic exercise;Patient/family education;Stair training;Balance training;Functional mobility training    PT Goals (Current goals can be found in the Care Plan section)  Acute Rehab PT Goals Patient Stated Goal: go home PT Goal Formulation: With patient Time For Goal Achievement: 12/21/21 Potential to Achieve Goals: Good    Frequency 7X/week   Barriers to discharge        Co-evaluation               AM-PAC PT "6 Clicks" Mobility  Outcome Measure Help needed turning from your back to your side while in a flat bed without using bedrails?: None Help needed moving from lying on your back to sitting on the side of a flat bed without using bedrails?: None Help needed moving to and from a bed to a chair (including  a wheelchair)?: A Little Help needed standing up from a chair using your arms (e.g., wheelchair or bedside chair)?: A Little Help needed to walk in hospital room?: A Little Help needed climbing 3-5 steps with a railing? : A Little 6 Click Score: 20    End of Session Equipment Utilized During Treatment: Gait belt Activity Tolerance: Patient tolerated treatment well Patient left: in bed;with call bell/phone within  reach Nurse Communication: Mobility status PT Visit Diagnosis: Other abnormalities of gait and mobility (R26.89);Muscle weakness (generalized) (M62.81)    Time: 1021-1173 PT Time Calculation (min) (ACUTE ONLY): 27 min   Charges:   PT Evaluation $PT Eval Low Complexity: 1 Low PT Treatments $Gait Training: 8-22 mins        Abran Richard, PT Acute Rehab Services Pager [phone] [facility] [place] 12/07/2021, 1:42 PM

## 2021-12-07 NOTE — Transfer of Care (Signed)
Immediate Anesthesia Transfer of Care Note  Patient: Shirley Cameron  Procedure(s) Performed: TOTAL KNEE ARTHROPLASTY (Right: Knee)  Patient Location: PACU  Anesthesia Type:Spinal and MAC combined with regional for post-op pain  Level of Consciousness: awake, alert , oriented and patient cooperative  Airway & Oxygen Therapy: Patient Spontanous Breathing and Patient connected to face mask oxygen  Post-op Assessment: Report given to RN and Post -op Vital signs reviewed and stable  Post vital signs: Reviewed and stable  Last Vitals:  Vitals Value Taken Time  BP 110/82 12/07/21 0957  Temp    Pulse 81 12/07/21 1000  Resp 17 12/07/21 1000  SpO2 98 % 12/07/21 1000  Vitals shown include unvalidated device data.  Last Pain:  Vitals:   12/07/21 0622  TempSrc:   PainSc: 4       Patients Stated Pain Goal: 4 (34/03/52 4818)  Complications: No notable events documented.

## 2021-12-07 NOTE — Op Note (Signed)
DATE OF SURGERY:  12/07/2021 TIME: 9:40 AM  PATIENT NAME:  Shirley Cameron   AGE: 50 y.o.    PRE-OPERATIVE DIAGNOSIS: Right knee posttraumatic arthritis, tibial malunion with severe tibia varus  POST-OPERATIVE DIAGNOSIS:  Same  PROCEDURE: Right total Knee Arthroplasty  SURGEON:  Arriah Wadle A Kellan Boehlke, MD   ASSISTANT: Izola Price, RNFA, present and scrubbed throughout the case, critical for assistance with exposure, retraction, instrumentation, and closure.   OPERATIVE IMPLANTS:  Implant Name Type Inv. Item Serial No. Manufacturer Lot No. LRB No. Used Action  CEMENT BONE REFOBACIN [id number] [place] - [id number] Cement CEMENT BONE REFOBACIN R1X40 [place]  [employer]ON(ORTH,TRAU,BIO,SG) [id number] Right 2 Implanted  headed screw 57mm length Screw   ZIMMER KNEE [id number] Right 1 Implanted  headed screw 33mm length Screw   ZIMMER KNEE [id number] Right 1 Implanted  TIBIA STEM 5 DEG SZ E R KNEE - [id number] Knees TIBIA STEM 5 DEG SZ E R KNEE  [employer]ON(ORTH,TRAU,BIO,SG) [id number] Right 1 Implanted  STEM TIB ST PERS 14+30 - [id number] Stem STEM TIB ST PERS 14+30  [employer]ON(ORTH,TRAU,BIO,SG) [id number] Right 1 Implanted  COMP FEM CMT PERSONA SZ7 RT - [id number] Joint COMP FEM CMT PERSONA SZ7 RT  [employer]ON(ORTH,TRAU,BIO,SG) [id number] Right 1 Implanted  STEM POLY PAT PLY 64M KNEE - [id number] Knees STEM POLY PAT PLY 64M KNEE  [employer]ON(ORTH,TRAU,BIO,SG) [id number] Right 1 Implanted  INSERT TIB ARTISURF SZ6-7 - [id number] Insert INSERT TIB ARTISURF SZ6-7  [employer]ON(ORTH,TRAU,BIO,SG) [id number] Right 1 Implanted      PREOPERATIVE INDICATIONS:  Shirley Cameron is a 50 y.o. year old female with end stage bone on bone degenerative arthritis of the knee who failed conservative treatment, including injections, antiinflammatories, activity modification, and assistive devices, and had significant impairment of their activities of daily living, and elected for Total Knee Arthroplasty.   The risks, benefits, and  alternatives were discussed at length including but not limited to the risks of infection, bleeding, nerve injury, stiffness, blood clots, the need for revision surgery, cardiopulmonary complications, among others, and they were willing to proceed.  OPERATIVE FINDINGS AND UNIQUE ASPECTS OF THE CASE: Custom PSI cutting guides utilized given patient's severe tibial varus deformity of 14 degrees.  ESTIMATED BLOOD LOSS: 50cc  OPERATIVE DESCRIPTION:   Once adequate anesthesia, preoperative antibiotics, 2 gm of ancef,1 gm of Tranexamic Acid, and 8 mg of Decadron administered, the patient was positioned supine with a right thigh tourniquet placed.  The right lower extremity was prepped and draped in sterile fashion.  A time-  out was performed identifying the patient, planned procedure, and the appropriate extremity.     The leg was  exsanguinated, tourniquet elevated to 250 mmHg.  A midline incision was   made followed by median parapatellar arthrotomy. Anterior horn of the medial meniscus was released and resected. A medial release was performed, the infrapatellar fat pad was resected with care taken to protect the patellar tendon. The suprapatellar fat was removed to exposed the distal anterior femur. The anterior horn of the lateral meniscus and ACL were released.    Following initial  exposure, attention was first to the femur.  The custom femoral cutting guide was applied to the distal femur and appropriately seated.  The distal cutting pins were drilled and the drill holes were made for the rotation guide. . The distal femur was resected.  Following this resection, the tibia was subluxated anteriorly.  The custom tibial cut guide was applied to the proximal tibia.  Using a drop rod,  we assessed appropriate tibial slope and varus valgus based on preoperative planning.  We were pleased with the alignment.  The proximal tibia was resected.  We confirmed the gap would be   stable medially and laterally  with a size 87mm spacer block as well as confirmed that the tibial cut was perpendicular in the coronal plane, checking with an alignment rod.    Once this was done, we turned our attention to completing the femoral cuts.  The pin holes for the the planned femoral cut was noted to be overly externally rotated and create an oblique flexion space.  We elected to use the standard femoral sizer and rotation guide to reduce the amount of external rotation to and more appropriate rectangular flexion space now matching the transepicondylar axis.  To be the posterior femoral referencing femoral sizer was placed under to the posterior condyles with 3 degrees of external rotational. The femur was sized to be a size 7 in the anterior-  posterior dimension. The   anterior, posterior, and  chamfer cuts were made without difficulty nor   notching making certain that I was along the anterior cortex to help   with flexion gap stability.       At this point, the tibia was sized to be a size E.  The size E tray was   then pinned in position. Trial reduction was now carried with a size 7 femur,  E tibia, a 12 mm MC insert which had excellent symmetric stability and in extension.  It was a little bit tight in flexion and the PCL was partially released to create an appropriate flexion gap.  Attention was next directed to the patella.  Precut  measurement was noted to be 20 mm.  I resected down to 13 mm and used a  39mm patellar button to restore patellar height as well as cover the cut surface.     The patella lug holes were drilled and a 32 mm patella poly trial was placed.    The knee was brought to full extension with good flexion stability with the patella   tracking through the trochlea without application of pressure.     Next the femoral component was again assessed and determined to be seated and appropriately lateralized.  The femoral lug holes were drilled.  The femoral component was then removed.Tibial  component was again assessed and felt to be seated and appropriately rotated with the medial third of the tubercle. The tibia was then drilled for a short 30 mm stem, and keel punched.     Final components were  opened and antibiotic cement was mixed.     Final implants were then cemented onto cleaned and dried cut surfaces of bone with the knee brought to extension with a 12 mm MC poly. The knee was irrigated with sterile Betadine diluted in saline as well as pulse lavage normal saline.  The synovial lining was  then injected a dilute Exparel.         Once the cement had fully cured, excess cement was removed   throughout the knee.  I confirmed that I was satisfied with the range of   motion and stability, and the final 12 mm MC poly insert was chosen.  It was   placed into the knee.         The tourniquet had been let down at 90 minutes.  No significant   hemostasis was required.  The medial parapatellar arthrotomy was then reapproximated  using #1 Stratafix sutures with the knee  in flexion.  The   remaining wound was closed with 0 stratafix, 2-0 Vicryl, and running 3-0 Monocryl.   The knee was cleaned, dried, dressed sterilely using Dermabond and   Aquacel dressing.  The patient was then  brought to recovery room in stable condition, tolerating the procedure  well. There were no complications.   Post op recs: WB: WBAT RLE Abx: ancef x23 hours post op, cefadroxil 500BID x7 days given multiple prior knee surgeries Imaging: PACU xrays DVT prophylaxis: Eliquis 2.5mg  BID x4 weeks Follow up: 2 weeks after surgery for a wound check with Dr. Zachery Dakins at [facility].  Address: [address], [place], [place] [address]  Office Phone: [phone]  Charlies Constable, MD Orthopaedic Surgery

## 2021-12-07 NOTE — Discharge Instructions (Signed)
INSTRUCTIONS AFTER JOINT REPLACEMENT  ° °Remove items at home which could result in a fall. This includes throw rugs or furniture in walking pathways °ICE to the affected joint every three hours while awake for 30 minutes at a time, for at least the first 3-5 days, and then as needed for pain and swelling.  Continue to use ice for pain and swelling. You may notice swelling that will progress down to the foot and ankle.  This is normal after surgery.  Elevate your leg when you are not up walking on it.   °Continue to use the breathing machine you got in the hospital (incentive spirometer) which will help keep your temperature down.  It is common for your temperature to cycle up and down following surgery, especially at night when you are not up moving around and exerting yourself.  The breathing machine keeps your lungs expanded and your temperature down. ° ° °DIET:  As you were doing prior to hospitalization, we recommend a well-balanced diet. ° °DRESSING / WOUND CARE / SHOWERING ° °Keep the surgical dressing until follow up.  The dressing is water proof, so you can shower without any extra covering.  IF THE DRESSING FALLS OFF or the wound gets wet inside, change the dressing with sterile gauze.  Please use good hand washing techniques before changing the dressing.  Do not use any lotions or creams on the incision until instructed by your surgeon.   ° °ACTIVITY ° °Increase activity slowly as tolerated, but follow the weight bearing instructions below.   °No driving for 6 weeks or until further direction given by your physician.  You cannot drive while taking narcotics.  °No lifting or carrying greater than 10 lbs. until further directed by your surgeon. °Avoid periods of inactivity such as sitting longer than an hour when not asleep. This helps prevent blood clots.  °You may return to work once you are authorized by your doctor.  ° ° ° °WEIGHT BEARING  ° °Weight bearing as tolerated with assist device (walker, cane,  etc) as directed, use it as long as suggested by your surgeon or therapist, typically at least 4-6 weeks. ° ° °EXERCISES ° °Results after joint replacement surgery are often greatly improved when you follow the exercise, range of motion and muscle strengthening exercises prescribed by your doctor. Safety measures are also important to protect the joint from further injury. Any time any of these exercises cause you to have increased pain or swelling, decrease what you are doing until you are comfortable again and then slowly increase them. If you have problems or questions, call your caregiver or physical therapist for advice.  ° °Rehabilitation is important following a joint replacement. After just a few days of immobilization, the muscles of the leg can become weakened and shrink (atrophy).  These exercises are designed to build up the tone and strength of the thigh and leg muscles and to improve motion. Often times heat used for twenty to thirty minutes before working out will loosen up your tissues and help with improving the range of motion but do not use heat for the first two weeks following surgery (sometimes heat can increase post-operative swelling).  ° °These exercises can be done on a training (exercise) mat, on the floor, on a table or on a bed. Use whatever works the best and is most comfortable for you.    Use music or television while you are exercising so that the exercises are a pleasant break in your   day. This will make your life better with the exercises acting as a break in your routine that you can look forward to.   Perform all exercises about fifteen times, three times per day or as directed.  You should exercise both the operative leg and the other leg as well.  Exercises include:   Quad Sets - Tighten up the muscle on the front of the thigh (Quad) and hold for 5-10 seconds.   Straight Leg Raises - With your knee straight (if you were given a brace, keep it on), lift the leg to 60  degrees, hold for 3 seconds, and slowly lower the leg.  Perform this exercise against resistance later as your leg gets stronger.  Leg Slides: Lying on your back, slowly slide your foot toward your buttocks, bending your knee up off the floor (only go as far as is comfortable). Then slowly slide your foot back down until your leg is flat on the floor again.  Angel Wings: Lying on your back spread your legs to the side as far apart as you can without causing discomfort.  Hamstring Strength:  Lying on your back, push your heel against the floor with your leg straight by tightening up the muscles of your buttocks.  Repeat, but this time bend your knee to a comfortable angle, and push your heel against the floor.  You may put a pillow under the heel to make it more comfortable if necessary.   A rehabilitation program following joint replacement surgery can speed recovery and prevent re-injury in the future due to weakened muscles. Contact your doctor or a physical therapist for more information on knee rehabilitation.    CONSTIPATION  Constipation is defined medically as fewer than three stools per week and severe constipation as less than one stool per week.  Even if you have a regular bowel pattern at home, your normal regimen is likely to be disrupted due to multiple reasons following surgery.  Combination of anesthesia, postoperative narcotics, change in appetite and fluid intake all can affect your bowels.   YOU MUST use at least one of the following options; they are listed in order of increasing strength to get the job done.  They are all available over the counter, and you may need to use some, POSSIBLY even all of these options:    Drink plenty of fluids (prune juice may be helpful) and high fiber foods Colace 100 mg by mouth twice a day  Senokot for constipation as directed and as needed Dulcolax (bisacodyl), take with full glass of water  Miralax (polyethylene glycol) once or twice a day as  needed.  If you have tried all these things and are unable to have a bowel movement in the first 3-4 days after surgery call either your surgeon or your primary doctor.    If you experience loose stools or diarrhea, hold the medications until you stool forms back up.  If your symptoms do not get better within 1 week or if they get worse, check with your doctor.  If you experience "the worst abdominal pain ever" or develop nausea or vomiting, please contact the office immediately for further recommendations for treatment.   ITCHING:  If you experience itching with your medications, try taking only a single pain pill, or even half a pain pill at a time.  You can also use Benadryl over the counter for itching or also to help with sleep.   TED HOSE STOCKINGS:  Use stockings on both  legs until for at least 2 weeks or as directed by physician office. They may be removed at night for sleeping.  MEDICATIONS:  See your medication summary on the After Visit Summary that nursing will review with you.  You may have some home medications which will be placed on hold until you complete the course of blood thinner medication.  It is important for you to complete the blood thinner medication as prescribed.   Blood clot prevention (DVT Prophylaxis): After surgery you are at an increased risk for a blood clot. you were prescribed a blood thinner, Eliquis 2.5mg , to be taken twice daily for a total of 4 weeks from surgery to help reduce your risk of getting a blood clot. This will help prevent a blood clot. Signs of a pulmonary embolus (blood clot in the lungs) include sudden short of breath, feeling lightheaded or dizzy, chest pain with a deep breath, rapid pulse rapid breathing. Signs of a blood clot in your arms or legs include new unexplained swelling and cramping, warm, red or darkened skin around the painful area. Please call the office or 911 right away if these signs or symptoms develop.  PRECAUTIONS:  If you  experience chest pain or shortness of breath - call 911 immediately for transfer to the hospital emergency department.   If you develop a fever greater that 101 F, purulent drainage from wound, increased redness or drainage from wound, foul odor from the wound/dressing, or calf pain - CONTACT YOUR SURGEON.                                                   FOLLOW-UP APPOINTMENTS:  If you do not already have a post-op appointment, please call the office for an appointment to be seen by your surgeon.  Guidelines for how soon to be seen are listed in your After Visit Summary, but are typically between 1-4 weeks after surgery.  OTHER INSTRUCTIONS:   Knee Replacement:  Do not place pillow under knee, focus on keeping the knee straight while resting. CPM instructions: 0-90 degrees, 2 hours in the morning, 2 hours in the afternoon, and 2 hours in the evening. Place foam block, curve side up under heel at all times except when in CPM or when walking.  DO NOT modify, tear, cut, or change the foam block in any way.  POST-OPERATIVE OPIOID TAPER INSTRUCTIONS: It is important to wean off of your opioid medication as soon as possible. If you do not need pain medication after your surgery it is ok to stop day one. Opioids include: Codeine, Hydrocodone(Norco, Vicodin), Oxycodone(Percocet, oxycontin) and hydromorphone amongst others.  Long term and even short term use of opiods can cause: Increased pain response Dependence Constipation Depression Respiratory depression And more.  Withdrawal symptoms can include Flu like symptoms Nausea, vomiting And more Techniques to manage these symptoms Hydrate well Eat regular healthy meals Stay active Use relaxation techniques(deep breathing, meditating, yoga) Do Not substitute Alcohol to help with tapering If you have been on opioids for less than two weeks and do not have pain than it is ok to stop all together.  Plan to wean off of opioids This plan should  start within one week post op of your joint replacement. Maintain the same interval or time between taking each dose and first decrease the dose.  Cut the  total daily intake of opioids by one tablet each day Next start to increase the time between doses. The last dose that should be eliminated is the evening dose.   MAKE SURE YOU:  Understand these instructions.  Get help right away if you are not doing well or get worse.    Thank you for letting us be a part of your medical care team.  It is a privilege we respect greatly.  We hope these instructions will help you stay on track for a fast and full recovery!        

## 2021-12-07 NOTE — Anesthesia Procedure Notes (Signed)
Spinal  Patient location during procedure: OR Start time: 12/07/2021 7:14 AM End time: 12/07/2021 7:19 AM Reason for block: surgical anesthesia Staffing Performed: anesthesiologist  Anesthesiologist: Barnet Glasgow, MD Preanesthetic Checklist Completed: patient identified, IV checked, risks and benefits discussed, surgical consent, monitors and equipment checked, pre-op evaluation and timeout performed Spinal Block Patient position: sitting Prep: DuraPrep and site prepped and draped Patient monitoring: heart rate, cardiac monitor, continuous pulse ox and blood pressure Approach: midline Location: L3-4 Injection technique: single-shot Needle Needle type: Pencan  Needle gauge: 24 G Needle length: 10 cm Needle insertion depth: 6 cm Assessment Sensory level: T4 Events: CSF return Additional Notes  2 Attempt (s). Pt tolerated procedure well.

## 2021-12-09 DIAGNOSIS — K219 Gastro-esophageal reflux disease without esophagitis: Secondary | ICD-10-CM | POA: Diagnosis not present

## 2021-12-09 DIAGNOSIS — Z8541 Personal history of malignant neoplasm of cervix uteri: Secondary | ICD-10-CM | POA: Diagnosis not present

## 2021-12-09 DIAGNOSIS — I10 Essential (primary) hypertension: Secondary | ICD-10-CM | POA: Diagnosis not present

## 2021-12-09 DIAGNOSIS — Z471 Aftercare following joint replacement surgery: Secondary | ICD-10-CM | POA: Diagnosis not present

## 2021-12-09 DIAGNOSIS — Z85818 Personal history of malignant neoplasm of other sites of lip, oral cavity, and pharynx: Secondary | ICD-10-CM | POA: Diagnosis not present

## 2021-12-09 DIAGNOSIS — F4310 Post-traumatic stress disorder, unspecified: Secondary | ICD-10-CM | POA: Diagnosis not present

## 2021-12-09 DIAGNOSIS — F419 Anxiety disorder, unspecified: Secondary | ICD-10-CM | POA: Diagnosis not present

## 2021-12-09 DIAGNOSIS — Z792 Long term (current) use of antibiotics: Secondary | ICD-10-CM | POA: Diagnosis not present

## 2021-12-09 DIAGNOSIS — Z7901 Long term (current) use of anticoagulants: Secondary | ICD-10-CM | POA: Diagnosis not present

## 2021-12-09 DIAGNOSIS — Z96651 Presence of right artificial knee joint: Secondary | ICD-10-CM | POA: Diagnosis not present

## 2021-12-10 ENCOUNTER — Encounter (HOSPITAL_COMMUNITY): Payer: Self-pay | Admitting: Orthopedic Surgery

## 2021-12-10 DIAGNOSIS — Z85818 Personal history of malignant neoplasm of other sites of lip, oral cavity, and pharynx: Secondary | ICD-10-CM | POA: Diagnosis not present

## 2021-12-10 DIAGNOSIS — K219 Gastro-esophageal reflux disease without esophagitis: Secondary | ICD-10-CM | POA: Diagnosis not present

## 2021-12-10 DIAGNOSIS — F4310 Post-traumatic stress disorder, unspecified: Secondary | ICD-10-CM | POA: Diagnosis not present

## 2021-12-10 DIAGNOSIS — Z792 Long term (current) use of antibiotics: Secondary | ICD-10-CM | POA: Diagnosis not present

## 2021-12-10 DIAGNOSIS — F419 Anxiety disorder, unspecified: Secondary | ICD-10-CM | POA: Diagnosis not present

## 2021-12-10 DIAGNOSIS — Z471 Aftercare following joint replacement surgery: Secondary | ICD-10-CM | POA: Diagnosis not present

## 2021-12-10 DIAGNOSIS — Z8541 Personal history of malignant neoplasm of cervix uteri: Secondary | ICD-10-CM | POA: Diagnosis not present

## 2021-12-10 DIAGNOSIS — Z7901 Long term (current) use of anticoagulants: Secondary | ICD-10-CM | POA: Diagnosis not present

## 2021-12-10 DIAGNOSIS — I10 Essential (primary) hypertension: Secondary | ICD-10-CM | POA: Diagnosis not present

## 2021-12-10 DIAGNOSIS — Z96651 Presence of right artificial knee joint: Secondary | ICD-10-CM | POA: Diagnosis not present

## 2021-12-11 DIAGNOSIS — Z8541 Personal history of malignant neoplasm of cervix uteri: Secondary | ICD-10-CM | POA: Diagnosis not present

## 2021-12-11 DIAGNOSIS — K219 Gastro-esophageal reflux disease without esophagitis: Secondary | ICD-10-CM | POA: Diagnosis not present

## 2021-12-11 DIAGNOSIS — Z792 Long term (current) use of antibiotics: Secondary | ICD-10-CM | POA: Diagnosis not present

## 2021-12-11 DIAGNOSIS — Z471 Aftercare following joint replacement surgery: Secondary | ICD-10-CM | POA: Diagnosis not present

## 2021-12-11 DIAGNOSIS — F4310 Post-traumatic stress disorder, unspecified: Secondary | ICD-10-CM | POA: Diagnosis not present

## 2021-12-11 DIAGNOSIS — I10 Essential (primary) hypertension: Secondary | ICD-10-CM | POA: Diagnosis not present

## 2021-12-11 DIAGNOSIS — F419 Anxiety disorder, unspecified: Secondary | ICD-10-CM | POA: Diagnosis not present

## 2021-12-11 DIAGNOSIS — Z7901 Long term (current) use of anticoagulants: Secondary | ICD-10-CM | POA: Diagnosis not present

## 2021-12-11 DIAGNOSIS — Z85818 Personal history of malignant neoplasm of other sites of lip, oral cavity, and pharynx: Secondary | ICD-10-CM | POA: Diagnosis not present

## 2021-12-11 DIAGNOSIS — Z96651 Presence of right artificial knee joint: Secondary | ICD-10-CM | POA: Diagnosis not present

## 2021-12-15 DIAGNOSIS — K219 Gastro-esophageal reflux disease without esophagitis: Secondary | ICD-10-CM | POA: Diagnosis not present

## 2021-12-15 DIAGNOSIS — F4310 Post-traumatic stress disorder, unspecified: Secondary | ICD-10-CM | POA: Diagnosis not present

## 2021-12-15 DIAGNOSIS — F419 Anxiety disorder, unspecified: Secondary | ICD-10-CM | POA: Diagnosis not present

## 2021-12-15 DIAGNOSIS — Z7901 Long term (current) use of anticoagulants: Secondary | ICD-10-CM | POA: Diagnosis not present

## 2021-12-15 DIAGNOSIS — Z96651 Presence of right artificial knee joint: Secondary | ICD-10-CM | POA: Diagnosis not present

## 2021-12-15 DIAGNOSIS — Z8541 Personal history of malignant neoplasm of cervix uteri: Secondary | ICD-10-CM | POA: Diagnosis not present

## 2021-12-15 DIAGNOSIS — Z85818 Personal history of malignant neoplasm of other sites of lip, oral cavity, and pharynx: Secondary | ICD-10-CM | POA: Diagnosis not present

## 2021-12-15 DIAGNOSIS — Z792 Long term (current) use of antibiotics: Secondary | ICD-10-CM | POA: Diagnosis not present

## 2021-12-15 DIAGNOSIS — I10 Essential (primary) hypertension: Secondary | ICD-10-CM | POA: Diagnosis not present

## 2021-12-15 DIAGNOSIS — Z471 Aftercare following joint replacement surgery: Secondary | ICD-10-CM | POA: Diagnosis not present

## 2021-12-16 DIAGNOSIS — F4310 Post-traumatic stress disorder, unspecified: Secondary | ICD-10-CM | POA: Diagnosis not present

## 2021-12-16 DIAGNOSIS — Z471 Aftercare following joint replacement surgery: Secondary | ICD-10-CM | POA: Diagnosis not present

## 2021-12-16 DIAGNOSIS — F419 Anxiety disorder, unspecified: Secondary | ICD-10-CM | POA: Diagnosis not present

## 2021-12-16 DIAGNOSIS — Z792 Long term (current) use of antibiotics: Secondary | ICD-10-CM | POA: Diagnosis not present

## 2021-12-16 DIAGNOSIS — Z85818 Personal history of malignant neoplasm of other sites of lip, oral cavity, and pharynx: Secondary | ICD-10-CM | POA: Diagnosis not present

## 2021-12-16 DIAGNOSIS — Z96651 Presence of right artificial knee joint: Secondary | ICD-10-CM | POA: Diagnosis not present

## 2021-12-16 DIAGNOSIS — Z7901 Long term (current) use of anticoagulants: Secondary | ICD-10-CM | POA: Diagnosis not present

## 2021-12-16 DIAGNOSIS — K219 Gastro-esophageal reflux disease without esophagitis: Secondary | ICD-10-CM | POA: Diagnosis not present

## 2021-12-16 DIAGNOSIS — Z8541 Personal history of malignant neoplasm of cervix uteri: Secondary | ICD-10-CM | POA: Diagnosis not present

## 2021-12-16 DIAGNOSIS — I10 Essential (primary) hypertension: Secondary | ICD-10-CM | POA: Diagnosis not present

## 2021-12-18 DIAGNOSIS — M1711 Unilateral primary osteoarthritis, right knee: Secondary | ICD-10-CM | POA: Diagnosis not present

## 2021-12-18 DIAGNOSIS — K219 Gastro-esophageal reflux disease without esophagitis: Secondary | ICD-10-CM | POA: Diagnosis not present

## 2021-12-18 DIAGNOSIS — Z471 Aftercare following joint replacement surgery: Secondary | ICD-10-CM | POA: Diagnosis not present

## 2021-12-18 DIAGNOSIS — F419 Anxiety disorder, unspecified: Secondary | ICD-10-CM | POA: Diagnosis not present

## 2021-12-18 DIAGNOSIS — F4310 Post-traumatic stress disorder, unspecified: Secondary | ICD-10-CM | POA: Diagnosis not present

## 2021-12-18 DIAGNOSIS — Z792 Long term (current) use of antibiotics: Secondary | ICD-10-CM | POA: Diagnosis not present

## 2021-12-18 DIAGNOSIS — Z8541 Personal history of malignant neoplasm of cervix uteri: Secondary | ICD-10-CM | POA: Diagnosis not present

## 2021-12-18 DIAGNOSIS — Z85818 Personal history of malignant neoplasm of other sites of lip, oral cavity, and pharynx: Secondary | ICD-10-CM | POA: Diagnosis not present

## 2021-12-18 DIAGNOSIS — Z96651 Presence of right artificial knee joint: Secondary | ICD-10-CM | POA: Diagnosis not present

## 2021-12-18 DIAGNOSIS — I10 Essential (primary) hypertension: Secondary | ICD-10-CM | POA: Diagnosis not present

## 2021-12-18 DIAGNOSIS — Z7901 Long term (current) use of anticoagulants: Secondary | ICD-10-CM | POA: Diagnosis not present

## 2021-12-22 DIAGNOSIS — M1711 Unilateral primary osteoarthritis, right knee: Secondary | ICD-10-CM | POA: Diagnosis not present

## 2021-12-23 DIAGNOSIS — I10 Essential (primary) hypertension: Secondary | ICD-10-CM | POA: Diagnosis not present

## 2021-12-23 DIAGNOSIS — Z96651 Presence of right artificial knee joint: Secondary | ICD-10-CM | POA: Diagnosis not present

## 2021-12-23 DIAGNOSIS — Z8541 Personal history of malignant neoplasm of cervix uteri: Secondary | ICD-10-CM | POA: Diagnosis not present

## 2021-12-23 DIAGNOSIS — K219 Gastro-esophageal reflux disease without esophagitis: Secondary | ICD-10-CM | POA: Diagnosis not present

## 2021-12-23 DIAGNOSIS — Z7901 Long term (current) use of anticoagulants: Secondary | ICD-10-CM | POA: Diagnosis not present

## 2021-12-23 DIAGNOSIS — F419 Anxiety disorder, unspecified: Secondary | ICD-10-CM | POA: Diagnosis not present

## 2021-12-23 DIAGNOSIS — Z471 Aftercare following joint replacement surgery: Secondary | ICD-10-CM | POA: Diagnosis not present

## 2021-12-23 DIAGNOSIS — F4310 Post-traumatic stress disorder, unspecified: Secondary | ICD-10-CM | POA: Diagnosis not present

## 2021-12-23 DIAGNOSIS — Z85818 Personal history of malignant neoplasm of other sites of lip, oral cavity, and pharynx: Secondary | ICD-10-CM | POA: Diagnosis not present

## 2021-12-23 DIAGNOSIS — Z792 Long term (current) use of antibiotics: Secondary | ICD-10-CM | POA: Diagnosis not present

## 2021-12-24 ENCOUNTER — Encounter (HOSPITAL_COMMUNITY): Payer: Self-pay | Admitting: Orthopedic Surgery

## 2021-12-25 DIAGNOSIS — K219 Gastro-esophageal reflux disease without esophagitis: Secondary | ICD-10-CM | POA: Diagnosis not present

## 2021-12-25 DIAGNOSIS — Z8541 Personal history of malignant neoplasm of cervix uteri: Secondary | ICD-10-CM | POA: Diagnosis not present

## 2021-12-25 DIAGNOSIS — Z792 Long term (current) use of antibiotics: Secondary | ICD-10-CM | POA: Diagnosis not present

## 2021-12-25 DIAGNOSIS — Z7901 Long term (current) use of anticoagulants: Secondary | ICD-10-CM | POA: Diagnosis not present

## 2021-12-25 DIAGNOSIS — F419 Anxiety disorder, unspecified: Secondary | ICD-10-CM | POA: Diagnosis not present

## 2021-12-25 DIAGNOSIS — Z96651 Presence of right artificial knee joint: Secondary | ICD-10-CM | POA: Diagnosis not present

## 2021-12-25 DIAGNOSIS — Z85818 Personal history of malignant neoplasm of other sites of lip, oral cavity, and pharynx: Secondary | ICD-10-CM | POA: Diagnosis not present

## 2021-12-25 DIAGNOSIS — Z471 Aftercare following joint replacement surgery: Secondary | ICD-10-CM | POA: Diagnosis not present

## 2021-12-25 DIAGNOSIS — I10 Essential (primary) hypertension: Secondary | ICD-10-CM | POA: Diagnosis not present

## 2021-12-25 DIAGNOSIS — F4310 Post-traumatic stress disorder, unspecified: Secondary | ICD-10-CM | POA: Diagnosis not present

## 2021-12-29 DIAGNOSIS — K219 Gastro-esophageal reflux disease without esophagitis: Secondary | ICD-10-CM | POA: Diagnosis not present

## 2021-12-29 DIAGNOSIS — Z8541 Personal history of malignant neoplasm of cervix uteri: Secondary | ICD-10-CM | POA: Diagnosis not present

## 2021-12-29 DIAGNOSIS — Z96651 Presence of right artificial knee joint: Secondary | ICD-10-CM | POA: Diagnosis not present

## 2021-12-29 DIAGNOSIS — F4310 Post-traumatic stress disorder, unspecified: Secondary | ICD-10-CM | POA: Diagnosis not present

## 2021-12-29 DIAGNOSIS — I10 Essential (primary) hypertension: Secondary | ICD-10-CM | POA: Diagnosis not present

## 2021-12-29 DIAGNOSIS — Z85818 Personal history of malignant neoplasm of other sites of lip, oral cavity, and pharynx: Secondary | ICD-10-CM | POA: Diagnosis not present

## 2021-12-29 DIAGNOSIS — Z7901 Long term (current) use of anticoagulants: Secondary | ICD-10-CM | POA: Diagnosis not present

## 2021-12-29 DIAGNOSIS — Z792 Long term (current) use of antibiotics: Secondary | ICD-10-CM | POA: Diagnosis not present

## 2021-12-29 DIAGNOSIS — F419 Anxiety disorder, unspecified: Secondary | ICD-10-CM | POA: Diagnosis not present

## 2021-12-29 DIAGNOSIS — Z471 Aftercare following joint replacement surgery: Secondary | ICD-10-CM | POA: Diagnosis not present

## 2021-12-31 DIAGNOSIS — Z8541 Personal history of malignant neoplasm of cervix uteri: Secondary | ICD-10-CM | POA: Diagnosis not present

## 2021-12-31 DIAGNOSIS — I10 Essential (primary) hypertension: Secondary | ICD-10-CM | POA: Diagnosis not present

## 2021-12-31 DIAGNOSIS — Z7901 Long term (current) use of anticoagulants: Secondary | ICD-10-CM | POA: Diagnosis not present

## 2021-12-31 DIAGNOSIS — Z792 Long term (current) use of antibiotics: Secondary | ICD-10-CM | POA: Diagnosis not present

## 2021-12-31 DIAGNOSIS — K219 Gastro-esophageal reflux disease without esophagitis: Secondary | ICD-10-CM | POA: Diagnosis not present

## 2021-12-31 DIAGNOSIS — Z85818 Personal history of malignant neoplasm of other sites of lip, oral cavity, and pharynx: Secondary | ICD-10-CM | POA: Diagnosis not present

## 2021-12-31 DIAGNOSIS — Z471 Aftercare following joint replacement surgery: Secondary | ICD-10-CM | POA: Diagnosis not present

## 2021-12-31 DIAGNOSIS — F4310 Post-traumatic stress disorder, unspecified: Secondary | ICD-10-CM | POA: Diagnosis not present

## 2021-12-31 DIAGNOSIS — F419 Anxiety disorder, unspecified: Secondary | ICD-10-CM | POA: Diagnosis not present

## 2021-12-31 DIAGNOSIS — Z96651 Presence of right artificial knee joint: Secondary | ICD-10-CM | POA: Diagnosis not present

## 2022-01-06 DIAGNOSIS — F4310 Post-traumatic stress disorder, unspecified: Secondary | ICD-10-CM | POA: Diagnosis not present

## 2022-01-06 DIAGNOSIS — Z85818 Personal history of malignant neoplasm of other sites of lip, oral cavity, and pharynx: Secondary | ICD-10-CM | POA: Diagnosis not present

## 2022-01-06 DIAGNOSIS — Z471 Aftercare following joint replacement surgery: Secondary | ICD-10-CM | POA: Diagnosis not present

## 2022-01-06 DIAGNOSIS — Z7901 Long term (current) use of anticoagulants: Secondary | ICD-10-CM | POA: Diagnosis not present

## 2022-01-06 DIAGNOSIS — I10 Essential (primary) hypertension: Secondary | ICD-10-CM | POA: Diagnosis not present

## 2022-01-06 DIAGNOSIS — Z96651 Presence of right artificial knee joint: Secondary | ICD-10-CM | POA: Diagnosis not present

## 2022-01-06 DIAGNOSIS — Z8541 Personal history of malignant neoplasm of cervix uteri: Secondary | ICD-10-CM | POA: Diagnosis not present

## 2022-01-06 DIAGNOSIS — Z792 Long term (current) use of antibiotics: Secondary | ICD-10-CM | POA: Diagnosis not present

## 2022-01-06 DIAGNOSIS — K219 Gastro-esophageal reflux disease without esophagitis: Secondary | ICD-10-CM | POA: Diagnosis not present

## 2022-01-06 DIAGNOSIS — F419 Anxiety disorder, unspecified: Secondary | ICD-10-CM | POA: Diagnosis not present

## 2022-01-08 DIAGNOSIS — Z7901 Long term (current) use of anticoagulants: Secondary | ICD-10-CM | POA: Diagnosis not present

## 2022-01-08 DIAGNOSIS — Z96651 Presence of right artificial knee joint: Secondary | ICD-10-CM | POA: Diagnosis not present

## 2022-01-08 DIAGNOSIS — Z792 Long term (current) use of antibiotics: Secondary | ICD-10-CM | POA: Diagnosis not present

## 2022-01-08 DIAGNOSIS — Z85818 Personal history of malignant neoplasm of other sites of lip, oral cavity, and pharynx: Secondary | ICD-10-CM | POA: Diagnosis not present

## 2022-01-08 DIAGNOSIS — F419 Anxiety disorder, unspecified: Secondary | ICD-10-CM | POA: Diagnosis not present

## 2022-01-08 DIAGNOSIS — F4310 Post-traumatic stress disorder, unspecified: Secondary | ICD-10-CM | POA: Diagnosis not present

## 2022-01-08 DIAGNOSIS — I10 Essential (primary) hypertension: Secondary | ICD-10-CM | POA: Diagnosis not present

## 2022-01-08 DIAGNOSIS — Z8541 Personal history of malignant neoplasm of cervix uteri: Secondary | ICD-10-CM | POA: Diagnosis not present

## 2022-01-08 DIAGNOSIS — Z471 Aftercare following joint replacement surgery: Secondary | ICD-10-CM | POA: Diagnosis not present

## 2022-01-08 DIAGNOSIS — K219 Gastro-esophageal reflux disease without esophagitis: Secondary | ICD-10-CM | POA: Diagnosis not present

## 2022-01-13 DIAGNOSIS — I10 Essential (primary) hypertension: Secondary | ICD-10-CM | POA: Diagnosis not present

## 2022-01-13 DIAGNOSIS — Z8541 Personal history of malignant neoplasm of cervix uteri: Secondary | ICD-10-CM | POA: Diagnosis not present

## 2022-01-13 DIAGNOSIS — Z792 Long term (current) use of antibiotics: Secondary | ICD-10-CM | POA: Diagnosis not present

## 2022-01-13 DIAGNOSIS — K219 Gastro-esophageal reflux disease without esophagitis: Secondary | ICD-10-CM | POA: Diagnosis not present

## 2022-01-13 DIAGNOSIS — Z471 Aftercare following joint replacement surgery: Secondary | ICD-10-CM | POA: Diagnosis not present

## 2022-01-13 DIAGNOSIS — Z85818 Personal history of malignant neoplasm of other sites of lip, oral cavity, and pharynx: Secondary | ICD-10-CM | POA: Diagnosis not present

## 2022-01-13 DIAGNOSIS — Z96651 Presence of right artificial knee joint: Secondary | ICD-10-CM | POA: Diagnosis not present

## 2022-01-13 DIAGNOSIS — F419 Anxiety disorder, unspecified: Secondary | ICD-10-CM | POA: Diagnosis not present

## 2022-01-13 DIAGNOSIS — Z7901 Long term (current) use of anticoagulants: Secondary | ICD-10-CM | POA: Diagnosis not present

## 2022-01-13 DIAGNOSIS — F4310 Post-traumatic stress disorder, unspecified: Secondary | ICD-10-CM | POA: Diagnosis not present

## 2022-01-14 DIAGNOSIS — I10 Essential (primary) hypertension: Secondary | ICD-10-CM | POA: Diagnosis not present

## 2022-01-14 DIAGNOSIS — Z6833 Body mass index (BMI) 33.0-33.9, adult: Secondary | ICD-10-CM | POA: Diagnosis not present

## 2022-01-14 DIAGNOSIS — Z96651 Presence of right artificial knee joint: Secondary | ICD-10-CM | POA: Diagnosis not present

## 2022-01-14 DIAGNOSIS — G4700 Insomnia, unspecified: Secondary | ICD-10-CM | POA: Diagnosis not present

## 2022-01-15 DIAGNOSIS — K219 Gastro-esophageal reflux disease without esophagitis: Secondary | ICD-10-CM | POA: Diagnosis not present

## 2022-01-15 DIAGNOSIS — Z7901 Long term (current) use of anticoagulants: Secondary | ICD-10-CM | POA: Diagnosis not present

## 2022-01-15 DIAGNOSIS — Z85818 Personal history of malignant neoplasm of other sites of lip, oral cavity, and pharynx: Secondary | ICD-10-CM | POA: Diagnosis not present

## 2022-01-15 DIAGNOSIS — Z471 Aftercare following joint replacement surgery: Secondary | ICD-10-CM | POA: Diagnosis not present

## 2022-01-15 DIAGNOSIS — Z8541 Personal history of malignant neoplasm of cervix uteri: Secondary | ICD-10-CM | POA: Diagnosis not present

## 2022-01-15 DIAGNOSIS — F419 Anxiety disorder, unspecified: Secondary | ICD-10-CM | POA: Diagnosis not present

## 2022-01-15 DIAGNOSIS — I10 Essential (primary) hypertension: Secondary | ICD-10-CM | POA: Diagnosis not present

## 2022-01-15 DIAGNOSIS — Z96651 Presence of right artificial knee joint: Secondary | ICD-10-CM | POA: Diagnosis not present

## 2022-01-15 DIAGNOSIS — Z792 Long term (current) use of antibiotics: Secondary | ICD-10-CM | POA: Diagnosis not present

## 2022-01-15 DIAGNOSIS — F4310 Post-traumatic stress disorder, unspecified: Secondary | ICD-10-CM | POA: Diagnosis not present

## 2022-02-22 DIAGNOSIS — M79642 Pain in left hand: Secondary | ICD-10-CM | POA: Diagnosis not present

## 2022-02-22 DIAGNOSIS — M79672 Pain in left foot: Secondary | ICD-10-CM | POA: Diagnosis not present

## 2022-02-22 DIAGNOSIS — M79671 Pain in right foot: Secondary | ICD-10-CM | POA: Diagnosis not present

## 2022-02-22 DIAGNOSIS — M79641 Pain in right hand: Secondary | ICD-10-CM | POA: Diagnosis not present

## 2022-02-22 DIAGNOSIS — M2550 Pain in unspecified joint: Secondary | ICD-10-CM | POA: Diagnosis not present

## 2022-02-25 DIAGNOSIS — Z6832 Body mass index (BMI) 32.0-32.9, adult: Secondary | ICD-10-CM | POA: Diagnosis not present

## 2022-02-25 DIAGNOSIS — I10 Essential (primary) hypertension: Secondary | ICD-10-CM | POA: Diagnosis not present

## 2022-02-25 DIAGNOSIS — G4700 Insomnia, unspecified: Secondary | ICD-10-CM | POA: Diagnosis not present

## 2022-03-04 DIAGNOSIS — M1711 Unilateral primary osteoarthritis, right knee: Secondary | ICD-10-CM | POA: Diagnosis not present

## 2022-03-16 ENCOUNTER — Encounter (HOSPITAL_COMMUNITY): Payer: Self-pay | Admitting: Neurology

## 2022-03-16 ENCOUNTER — Inpatient Hospital Stay (HOSPITAL_COMMUNITY)
Admission: EM | Admit: 2022-03-16 | Discharge: 2022-03-17 | DRG: 062 | Disposition: A | Discharge status: Home / Self Care | Payer: BC Managed Care – PPO | Attending: Neurology | Admitting: Neurology

## 2022-03-16 ENCOUNTER — Emergency Department (HOSPITAL_COMMUNITY): Payer: BC Managed Care – PPO

## 2022-03-16 ENCOUNTER — Inpatient Hospital Stay (HOSPITAL_COMMUNITY): Payer: BC Managed Care – PPO

## 2022-03-16 ENCOUNTER — Other Ambulatory Visit (HOSPITAL_COMMUNITY): Payer: BC Managed Care – PPO

## 2022-03-16 DIAGNOSIS — Z880 Allergy status to penicillin: Secondary | ICD-10-CM

## 2022-03-16 DIAGNOSIS — R42 Dizziness and giddiness: Secondary | ICD-10-CM

## 2022-03-16 DIAGNOSIS — G8194 Hemiplegia, unspecified affecting left nondominant side: Secondary | ICD-10-CM | POA: Diagnosis not present

## 2022-03-16 DIAGNOSIS — F419 Anxiety disorder, unspecified: Secondary | ICD-10-CM | POA: Diagnosis present

## 2022-03-16 DIAGNOSIS — Z882 Allergy status to sulfonamides status: Secondary | ICD-10-CM | POA: Diagnosis not present

## 2022-03-16 DIAGNOSIS — I6381 Other cerebral infarction due to occlusion or stenosis of small artery: Principal | ICD-10-CM | POA: Diagnosis present

## 2022-03-16 DIAGNOSIS — K219 Gastro-esophageal reflux disease without esophagitis: Secondary | ICD-10-CM | POA: Diagnosis present

## 2022-03-16 DIAGNOSIS — Z886 Allergy status to analgesic agent status: Secondary | ICD-10-CM | POA: Diagnosis not present

## 2022-03-16 DIAGNOSIS — R531 Weakness: Secondary | ICD-10-CM | POA: Diagnosis not present

## 2022-03-16 DIAGNOSIS — F32A Depression, unspecified: Secondary | ICD-10-CM | POA: Diagnosis not present

## 2022-03-16 DIAGNOSIS — Z6831 Body mass index (BMI) 31.0-31.9, adult: Secondary | ICD-10-CM

## 2022-03-16 DIAGNOSIS — F4310 Post-traumatic stress disorder, unspecified: Secondary | ICD-10-CM | POA: Diagnosis not present

## 2022-03-16 DIAGNOSIS — I10 Essential (primary) hypertension: Secondary | ICD-10-CM | POA: Diagnosis present

## 2022-03-16 DIAGNOSIS — Z79899 Other long term (current) drug therapy: Secondary | ICD-10-CM | POA: Diagnosis not present

## 2022-03-16 DIAGNOSIS — E785 Hyperlipidemia, unspecified: Secondary | ICD-10-CM | POA: Diagnosis present

## 2022-03-16 DIAGNOSIS — H5347 Heteronymous bilateral field defects: Secondary | ICD-10-CM | POA: Diagnosis not present

## 2022-03-16 DIAGNOSIS — Z888 Allergy status to other drugs, medicaments and biological substances status: Secondary | ICD-10-CM

## 2022-03-16 DIAGNOSIS — E669 Obesity, unspecified: Secondary | ICD-10-CM | POA: Diagnosis present

## 2022-03-16 DIAGNOSIS — R29707 NIHSS score 7: Secondary | ICD-10-CM | POA: Diagnosis present

## 2022-03-16 DIAGNOSIS — Z8673 Personal history of transient ischemic attack (TIA), and cerebral infarction without residual deficits: Secondary | ICD-10-CM | POA: Diagnosis present

## 2022-03-16 DIAGNOSIS — J45909 Unspecified asthma, uncomplicated: Secondary | ICD-10-CM | POA: Diagnosis not present

## 2022-03-16 DIAGNOSIS — Z7901 Long term (current) use of anticoagulants: Secondary | ICD-10-CM | POA: Diagnosis not present

## 2022-03-16 DIAGNOSIS — Z9884 Bariatric surgery status: Secondary | ICD-10-CM | POA: Diagnosis not present

## 2022-03-16 DIAGNOSIS — M1990 Unspecified osteoarthritis, unspecified site: Secondary | ICD-10-CM | POA: Diagnosis present

## 2022-03-16 DIAGNOSIS — Z8541 Personal history of malignant neoplasm of cervix uteri: Secondary | ICD-10-CM | POA: Diagnosis not present

## 2022-03-16 DIAGNOSIS — R29818 Other symptoms and signs involving the nervous system: Secondary | ICD-10-CM | POA: Diagnosis not present

## 2022-03-16 DIAGNOSIS — H547 Unspecified visual loss: Secondary | ICD-10-CM | POA: Diagnosis present

## 2022-03-16 DIAGNOSIS — R29810 Facial weakness: Secondary | ICD-10-CM | POA: Diagnosis not present

## 2022-03-16 DIAGNOSIS — I6389 Other cerebral infarction: Secondary | ICD-10-CM | POA: Diagnosis not present

## 2022-03-16 DIAGNOSIS — I639 Cerebral infarction, unspecified: Secondary | ICD-10-CM | POA: Diagnosis not present

## 2022-03-16 DIAGNOSIS — G9389 Other specified disorders of brain: Secondary | ICD-10-CM | POA: Diagnosis not present

## 2022-03-16 DIAGNOSIS — R110 Nausea: Secondary | ICD-10-CM | POA: Diagnosis not present

## 2022-03-16 LAB — COMPREHENSIVE METABOLIC PANEL
ALT: 60 U/L — ABNORMAL HIGH (ref 0–44)
AST: 53 U/L — ABNORMAL HIGH (ref 15–41)
Albumin: 4 g/dL (ref 3.5–5.0)
Alkaline Phosphatase: 92 U/L (ref 38–126)
Anion gap: 11 (ref 5–15)
BUN: 15 mg/dL (ref 6–20)
CO2: 23 mmol/L (ref 22–32)
Calcium: 9.3 mg/dL (ref 8.9–10.3)
Chloride: 103 mmol/L (ref 98–111)
Creatinine, Ser: 1.01 mg/dL — ABNORMAL HIGH (ref 0.44–1.00)
GFR, Estimated: >60 mL/min (ref >60)
Glucose, Bld: 136 mg/dL — ABNORMAL HIGH (ref 70–99)
Potassium: 4.1 mmol/L (ref 3.5–5.1)
Sodium: 137 mmol/L (ref 135–145)
Total Bilirubin: 0.8 mg/dL (ref 0.3–1.2)
Total Protein: 7 g/dL (ref 6.5–8.1)

## 2022-03-16 LAB — CBC
HCT: 33.1 % — ABNORMAL LOW (ref 36.0–46.0)
Hemoglobin: 10.5 g/dL — ABNORMAL LOW (ref 12.0–15.0)
MCH: 27.4 pg (ref 26.0–34.0)
MCHC: 31.7 g/dL (ref 30.0–36.0)
MCV: 86.4 fL (ref 80.0–100.0)
Platelets: 414 10*3/uL — ABNORMAL HIGH (ref 150–400)
RBC: 3.83 MIL/uL — ABNORMAL LOW (ref 3.87–5.11)
RDW: 13.3 % (ref 11.5–15.5)
WBC: 6.3 10*3/uL (ref 4.0–10.5)
nRBC: 0 % (ref 0.0–0.2)

## 2022-03-16 LAB — DIFFERENTIAL
Abs Immature Granulocytes: 0.02 10*3/uL (ref 0.00–0.07)
Basophils Absolute: 0.1 10*3/uL (ref 0.0–0.1)
Basophils Relative: 1 %
Eosinophils Absolute: 0.6 10*3/uL — ABNORMAL HIGH (ref 0.0–0.5)
Eosinophils Relative: 9 %
Immature Granulocytes: 0 %
Lymphocytes Relative: 30 %
Lymphs Abs: 1.9 10*3/uL (ref 0.7–4.0)
Monocytes Absolute: 0.6 10*3/uL (ref 0.1–1.0)
Monocytes Relative: 10 %
Neutro Abs: 3.2 10*3/uL (ref 1.7–7.7)
Neutrophils Relative %: 50 %

## 2022-03-16 LAB — ECHOCARDIOGRAM COMPLETE
AR max vel: 4.65 cm2
AV Area VTI: 5.22 cm2
AV Area mean vel: 5.22 cm2
AV Mean grad: 3 mmHg
AV Peak grad: 4.7 mmHg
Ao pk vel: 1.08 m/s
Area-P 1/2: 3.31 cm2
Height: 66 in
S' Lateral: 2.6 cm
Weight: 3111.13 oz

## 2022-03-16 LAB — HIV ANTIBODY (ROUTINE TESTING W REFLEX): HIV Screen 4th Generation wRfx: NONREACTIVE

## 2022-03-16 LAB — PROTIME-INR
INR: 0.9 (ref 0.8–1.2)
Prothrombin Time: 12.5 seconds (ref 11.4–15.2)

## 2022-03-16 LAB — I-STAT CHEM 8, ED
BUN: 19 mg/dL (ref 6–20)
Calcium, Ion: 1.08 mmol/L — ABNORMAL LOW (ref 1.15–1.40)
Chloride: 102 mmol/L (ref 98–111)
Creatinine, Ser: 0.9 mg/dL (ref 0.44–1.00)
Glucose, Bld: 134 mg/dL — ABNORMAL HIGH (ref 70–99)
HCT: 34 % — ABNORMAL LOW (ref 36.0–46.0)
Hemoglobin: 11.6 g/dL — ABNORMAL LOW (ref 12.0–15.0)
Potassium: 4.3 mmol/L (ref 3.5–5.1)
Sodium: 136 mmol/L (ref 135–145)
TCO2: 25 mmol/L (ref 22–32)

## 2022-03-16 LAB — I-STAT BETA HCG BLOOD, ED (MC, WL, AP ONLY): I-stat hCG, quantitative: <5 m[IU]/mL (ref <5)

## 2022-03-16 LAB — TROPONIN I (HIGH SENSITIVITY)
Troponin I (High Sensitivity): 7 ng/L (ref <18)
Troponin I (High Sensitivity): 6 ng/L (ref <18)

## 2022-03-16 LAB — APTT: aPTT: 21 seconds — ABNORMAL LOW (ref 24–36)

## 2022-03-16 LAB — CBG MONITORING, ED: Glucose-Capillary: 105 mg/dL — ABNORMAL HIGH (ref 70–99)

## 2022-03-16 MED ORDER — SODIUM CHLORIDE 0.9 % IV SOLN: INTRAVENOUS | Status: DC | PRN

## 2022-03-16 MED ORDER — IOHEXOL 350 MG/ML SOLN
100.0000 mL | Freq: Once | INTRAVENOUS | Status: AC | PRN
  Administered 2022-03-16: 60 mL via INTRAVENOUS

## 2022-03-16 MED ORDER — METOCLOPRAMIDE HCL 5 MG/ML IJ SOLN
10.0000 mg | Freq: Once | INTRAMUSCULAR | Status: AC
  Administered 2022-03-16: 10 mg via INTRAVENOUS
  Filled 2022-03-16: qty 2

## 2022-03-16 MED ORDER — TENECTEPLASE FOR STROKE
0.2500 mg/kg | PACK | Freq: Once | INTRAVENOUS | Status: AC
  Administered 2022-03-16: 22 mg via INTRAVENOUS
  Filled 2022-03-16: qty 10

## 2022-03-16 MED ORDER — SENNOSIDES-DOCUSATE SODIUM 8.6-50 MG PO TABS: 1.0000 | ORAL_TABLET | Freq: Every evening | ORAL | Status: DC | PRN

## 2022-03-16 MED ORDER — CLEVIDIPINE BUTYRATE 0.5 MG/ML IV EMUL
0.0000 mg/h | INTRAVENOUS | Status: DC
  Administered 2022-03-16: 2 mg/h via INTRAVENOUS

## 2022-03-16 MED ORDER — ACETAMINOPHEN 650 MG RE SUPP: 650.0000 mg | RECTAL | Status: DC | PRN

## 2022-03-16 MED ORDER — SODIUM CHLORIDE 0.9% FLUSH: 3.0000 mL | Freq: Once | INTRAVENOUS | Status: DC

## 2022-03-16 MED ORDER — STROKE: EARLY STAGES OF RECOVERY BOOK
Freq: Once | Status: DC
  Filled 2022-03-16: qty 1

## 2022-03-16 MED ORDER — ACETAMINOPHEN 325 MG PO TABS
650.0000 mg | ORAL_TABLET | ORAL | Status: DC | PRN
  Administered 2022-03-17: 650 mg via ORAL
  Filled 2022-03-16: qty 2

## 2022-03-16 MED ORDER — CHLORHEXIDINE GLUCONATE CLOTH 2 % EX PADS: 6.0000 | MEDICATED_PAD | Freq: Every day | CUTANEOUS | Status: DC

## 2022-03-16 MED ORDER — ACETAMINOPHEN 160 MG/5ML PO SOLN: 650.0000 mg | ORAL | Status: DC | PRN

## 2022-03-16 MED ORDER — ONDANSETRON HCL 4 MG/2ML IJ SOLN
4.0000 mg | Freq: Once | INTRAMUSCULAR | Status: AC
  Administered 2022-03-16: 4 mg via INTRAVENOUS

## 2022-03-16 MED ORDER — PANTOPRAZOLE SODIUM 40 MG IV SOLR
40.0000 mg | Freq: Every day | INTRAVENOUS | Status: DC
  Administered 2022-03-16: 40 mg via INTRAVENOUS
  Filled 2022-03-16: qty 10

## 2022-03-16 NOTE — H&P (Addendum)
NEUROLOGY H&P  ? ?Date of service: March 16, 2022 ?Patient Name: Shirley Cameron ?MRN:  [id number] ?DOB:  Oct 19, 1971 ?Reason for admission: stroke code s/p TNK ?_ _ _   _ __   _ __ _ _  __ __   _ __   __ _ ? ?History of Present Illness  ? ?She is a 51 year old woman with past medical history significant for asthma, oral and cervical cancer, depression, headache, hypertension, PTSD who presented with acute onset of vertigo and left-sided weakness beginning at 8 AM this morning.  This morning she woke up and drove to work and was completely at her normal baseline.  Beginning at 8 AM she developed subjective vertigo/room spinning and intractable nausea and vomiting.  She was weak on her left side.  She called 911 and it was pre-activated as a stroke code.  On arrival to the ED she was noted to have a stroke scale of 7 for vision impairment in all 4 quadrants left eye, facial droop, weakness in left arm and left leg, left-sided sensory deficit.  Patient was previously CT head was personally reviewed and was unremarkable.  Risks and benefits of TNK were discussed with patient and she elected to proceed.  Administration of TNK was delayed due to difficulty controlling blood pressure particularly her diastolic which required multiple rounds of labetalol and addition of clevidipine gtt.  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  Patient admitted to ICU for post TNK monitoring and further work-up for stroke. ? ?Patient was discharged from hospital in Dec after knee replacement on eliquis but confirmed she is no longer taking it. She did report chest pressure and was extremely anxious. Nausea controlled after '4mg'$  zofran x2 + reglan.  ?  ?ROS  ? ?Per HPI: all other systems reviewed and are negative ? ?Past History  ? ?I have reviewed the following: ? ?Past Medical History:  ?Diagnosis Date  ? Anemia   ? Anxiety   ? Arthritis   ? Asthma   ? Cancer  [facility])   ? oral and cervical  ? Complication of anesthesia   ? very slow/ hard to wake up and usually has itching  ? Depression   ? Elevated liver enzymes   ? GERD (gastroesophageal reflux disease)   ? Headache   ? Hypertension   ? Pneumonia   ? PTSD (post-traumatic stress disorder)   ? ?Past Surgical History:  ?Procedure Laterality Date  ? ABDOMINAL HYSTERECTOMY    ? ARTHROSCOPIC REPAIR ACL    ? BREAST SURGERY    ? CHOLECYSTECTOMY    ? CHONDROPLASTY Right 10/18/2019  ? Procedure: CHONDROPLASTY;  Surgeon: Hiram Gash, MD;  Location: [place];  Service: Orthopedics;  Laterality: Right;  ? EXPLORATORY LAPAROTOMY    ? FRACTURE SURGERY    ? ankle  ? GASTRIC ROUX-EN-Y  12/19/2008  ? HARDWARE REMOVAL    ? KNEE ARTHROSCOPY WITH LATERAL MENISECTOMY Right 10/18/2019  ? Procedure: KNEE ARTHROSCOPY WITH LATERAL MENISECTOMY;  Surgeon: Hiram Gash, MD;  Location: [place];  Service: Orthopedics;  Laterality: Right;  ? OSTEOTOMY AND TIBIAL SHORTENING    ? TONSILLECTOMY    ? TOTAL KNEE ARTHROPLASTY Right 12/07/2021  ? Procedure: TOTAL KNEE ARTHROPLASTY;  Surgeon: Willaim Sheng, MD;  Location: [facility];  Service: Orthopedics;  Laterality: Right;  ? WISDOM TOOTH EXTRACTION    ? ?History reviewed. No pertinent family history. ?  Social History  ? ?Socioeconomic History  ? Marital status: Married  ?  Spouse name: Not on file  ? Number of children: Not on file  ? Years of education: Not on file  ? Highest education level: Not on file  ?Occupational History  ? Not on file  ?Tobacco Use  ? Smoking status: Never  ? Smokeless tobacco: Never  ?Vaping Use  ? Vaping Use: Never used  ?Substance and Sexual Activity  ? Alcohol use: Yes  ?  Comment: rare  ? Drug use: Never  ? Sexual activity: Not on file  ?Other Topics Concern  ? Not on file  ?Social History Narrative  ? Not on file  ? ?Social Determinants of Health  ? ?Financial Resource Strain: Not on file  ?Food Insecurity: Not on file  ?Transportation  Needs: Not on file  ?Physical Activity: Not on file  ?Stress: Not on file  ?Social Connections: Not on file  ? ?Allergies  ?Allergen Reactions  ? Nsaids   ?  H/o gastric bypass  ? Penicillins Hives  ? Cephalexin Itching and Rash  ? Sulfamethoxazole-Trimethoprim Itching and Rash  ? ? ?Medications  ? ?Medications Prior to Admission  ?Medication Sig Dispense Refill Last Dose  ? hydrOXYzine (ATARAX) 50 MG tablet Take 50 mg by mouth 3 (three) times daily as needed for itching.   unk  ? lisinopril-hydrochlorothiazide (ZESTORETIC) 20-12.5 MG tablet Take 1 tablet by mouth daily.   03/15/2022  ? ondansetron (ZOFRAN) 8 MG tablet Take 8 mg by mouth every 8 (eight) hours as needed for nausea or vomiting.   unk  ? pantoprazole (PROTONIX) 40 MG tablet Take 40 mg by mouth daily as needed (acid reflux).   unk  ? promethazine (PHENERGAN) 25 MG tablet Take 25 mg by mouth every 6 (six) hours as needed for nausea or vomiting.   unk  ? propranolol (INDERAL) 80 MG tablet Take 120 mg by mouth 2 (two) times daily. Taking 1 1/2 tablet BID   03/15/2022 at 2100  ? tiZANidine (ZANAFLEX) 4 MG tablet Take 4 mg by mouth every 8 (eight) hours as needed for muscle spasms.   03/15/2022  ? apixaban (ELIQUIS) 2.5 MG TABS tablet Take 1 tablet (2.5 mg total) by mouth 2 (two) times daily. (Patient not taking: Reported on 03/16/2022) 60 tablet 0 Not Taking  ?  ? ? ?Current Facility-Administered Medications:  ?   stroke: mapping our early stages of recovery book, , Does not apply, Once, Derek Jack, MD ?  Place/Maintain arterial line, , , Until Discontinued **AND** 0.9 %  sodium chloride infusion, , Intra-arterial, PRN, Carmin Muskrat, MD ?  acetaminophen (TYLENOL) tablet 650 mg, 650 mg, Oral, Q4H PRN **OR** acetaminophen (TYLENOL) 160 MG/5ML solution 650 mg, 650 mg, Per Tube, Q4H PRN **OR** acetaminophen (TYLENOL) suppository 650 mg, 650 mg, Rectal, Q4H PRN, Derek Jack, MD ?  clevidipine (CLEVIPREX) infusion 0.5 mg/mL, 0-21 mg/hr, Intravenous,  Continuous, Derek Jack, MD, Stopped at 03/16/22 0920 ?  pantoprazole (PROTONIX) injection 40 mg, 40 mg, Intravenous, QHS, Derek Jack, MD ?  senna-docusate (Senokot-S) tablet 1 tablet, 1 tablet, Oral, QHS PRN, Derek Jack, MD ?  sodium chloride flush (NS) 0.9 % injection 3 mL, 3 mL, Intravenous, Once, Carmin Muskrat, MD ? ?Vitals  ? ?Vitals:  ? 03/16/22 1111 03/16/22 1115 03/16/22 1117 03/16/22 1130  ?BP: (!) 148/98  (!) 150/90 (!) 125/92  ?Pulse:  76 76   ?Resp: '11  11 18  '$ ?SpO2:  98%    ?Weight:      ?Height:      ?  ? ?Body mass index is 31.38 kg/m?. ? ?Physical Exam  ? ?Physical Exam ?Gen: A&O x4, extremely anxious ?HEENT: Atraumatic, normocephalic;mucous membranes moist; oropharynx clear, tongue without atrophy or fasciculations. ?Neck: Supple, trachea midline. ?Resp: CTAB, no w/r/r ?CV: RRR, no m/g/r; nml S1 and S2. 2+ symmetric peripheral pulses. ?Abd: soft/NT/ND; nabs x 4 quad ?Extrem: Nml bulk; no cyanosis, clubbing, or edema. ? ?Neuro: ?*MS: A&O x4. Follows multi-step commands.  ?*Speech: fluid, nondysarthric, able to name and repeat ?*CN:  ?  I: Deferred ?  II,III: PERRLA, VFF by confrontation R eye, unable to differentiate between 1 or 2 fingers in any quadrant of L eye, optic discs unable to be visualized 2/2 pupillary constriction ?  III,IV,VI: EOMI w/o nystagmus, no ptosis ?  V: Sensation intact from V1 to V3 to LT ?  VII: Eyelid closure was full.  L UMN facial droop ?  VIII: Hearing intact to voice ?  IX,X: Voice normal, palate elevates symmetrically  ?  XI: SCM/trap 5/5 bilat   ?XII: Tongue protrudes midline, no atrophy or fasciculations  ? ?*Motor:   Normal bulk.  No tremor, rigidity or bradykinesia. Subtle drift LUE. Minimal movement against gravity LLE. RUE and RLE full strength.  ?*Sensory: SILT ?*Coordination:  FNF intact bilat ?*Reflexes:  2+ and symmetric throughout without clonus; toes down-going bilat ?*Gait: deferred ? ?NIHSS ? ?1a Level of Conscious.: 0 ?1b LOC  Questions: 0 ?1c LOC Commands: 0 ?2 Best Gaze: 0 ?3 Visual: 1 ?4 Facial Palsy: 1 ?5a Motor Arm - left: 1 ?5b Motor Arm - Right: 0 ?6a Motor Leg - Left: 2 ?6b Motor Leg - Right: 0 ?7 Limb Ataxia: 0 ?8 Sensory: 1

## 2022-03-16 NOTE — ED Triage Notes (Signed)
[facility] for stroke symptoms. Pt was at work and felt left sided weakness, blurred vision, dizziness, nausea and vomiting. Last known well was 0800 this AM  ?

## 2022-03-16 NOTE — ED Provider Notes (Signed)
?[facility] NEURO/TRAUMA/SURGICAL ICU ?Provider Note ? ? ?CSN: [id number] ?Arrival date & time: 03/16/22  0848 ? ?An emergency department physician performed an initial assessment on this suspected stroke patient at [phone]. ? ?History ? ?Chief Complaint  ?Patient presents with  ? Code Stroke  ? ? ?Shirley Cameron is a 51 y.o. female. ? ?HPI ?Patient seen and evaluated on arrival with our neurology colleagues.  She arrives as a code stroke.  He was eventually obtained by the patient's husband and bedside.  He notes that she was in her usual state of this morning, but in route to work with something different.  Here she was found to have a new dizziness, left-sided weakness nausea, vomiting, and was designated as a code stroke on arrival.  Evaluation conducted with our neurology colleagues, level 5 caveat secondary to acuity of condition. ?  ? ?Home Medications ?Prior to Admission medications   ?Medication Sig Start Date End Date Taking? Authorizing Provider  ?hydrOXYzine (ATARAX) 50 MG tablet Take 50 mg by mouth 3 (three) times daily as needed for itching.   Yes [provider]  ?lisinopril-hydrochlorothiazide (ZESTORETIC) 20-12.5 MG tablet Take 1 tablet by mouth daily. 02/17/22  Yes [provider]  ?ondansetron (ZOFRAN) 8 MG tablet Take 8 mg by mouth every 8 (eight) hours as needed for nausea or vomiting.   Yes Joline Salt, RN  ?pantoprazole (PROTONIX) 40 MG tablet Take 40 mg by mouth daily as needed (acid reflux). 09/14/21  Yes [provider]  ?promethazine (PHENERGAN) 25 MG tablet Take 25 mg by mouth every 6 (six) hours as needed for nausea or vomiting.   Yes Joline Salt, RN  ?propranolol (INDERAL) 80 MG tablet Take 120 mg by mouth 2 (two) times daily. Taking 1 1/2 tablet BID   Yes Joline Salt, RN  ?tiZANidine (ZANAFLEX) 4 MG tablet Take 4 mg by mouth every 8 (eight) hours as needed for muscle spasms.   Yes [provider]  ?apixaban (ELIQUIS) 2.5 MG TABS tablet Take 1  tablet (2.5 mg total) by mouth 2 (two) times daily. ?Patient not taking: Reported on 03/16/2022 12/07/21   Willaim Sheng, MD  ?   ? ?Allergies    ?Nsaids, Penicillins, Cephalexin, and Sulfamethoxazole-trimethoprim   ? ?Review of Systems   ?Review of Systems  ?Unable to perform ROS: Acuity of condition  ? ?Physical Exam ?Updated Vital Signs ?BP (!) 124/100   Pulse 76   Temp 98.9 ?F (37.2 ?C) (Oral)   Resp (!) 21   Ht '5\' 6"'$  (1.676 m)   Wt 88.2 kg   SpO2 98%   BMI 31.38 kg/m?  ?Physical Exam ?Vitals and nursing note reviewed.  ?Constitutional:   ?   General: She is in acute distress.  ?   Appearance: She is well-developed. She is ill-appearing and diaphoretic.  ?HENT:  ?   Head: Normocephalic and atraumatic.  ?Eyes:  ?   Conjunctiva/sclera: Conjunctivae normal.  ?Cardiovascular:  ?   Rate and Rhythm: Normal rate and regular rhythm.  ?Pulmonary:  ?   Effort: Pulmonary effort is normal. No respiratory distress.  ?   Breath sounds: Normal breath sounds. No stridor.  ?Abdominal:  ?   General: There is no distension.  ?   Tenderness: There is no abdominal tenderness. There is no guarding.  ?Skin: ?   General: Skin is warm.  ?Neurological:  ?   Mental Status: She is alert and oriented to person, place, and time.  ?  Cranial Nerves: No cranial nerve deficit.  ?   Comments: Patient unable to completely participate in exam secondary to nausea, vomiting, she does move her extremities spontaneously, no gross facial asymmetry, speech is hesitant.  ?Psychiatric:     ?   Mood and Affect: Mood normal.  ? ? ?ED Results / Procedures / Treatments   ?Labs ?(all labs ordered are listed, but only abnormal results are displayed) ?Labs Reviewed  ?APTT - Abnormal; Notable for the following components:  ?    Result Value  ? aPTT 21 (*)   ? All other components within normal limits  ?CBC - Abnormal; Notable for the following components:  ? RBC 3.83 (*)   ? Hemoglobin 10.5 (*)   ? HCT 33.1 (*)   ? Platelets 414 (*)   ? All other  components within normal limits  ?DIFFERENTIAL - Abnormal; Notable for the following components:  ? Eosinophils Absolute 0.6 (*)   ? All other components within normal limits  ?COMPREHENSIVE METABOLIC PANEL - Abnormal; Notable for the following components:  ? Glucose, Bld 136 (*)   ? Creatinine, Ser 1.01 (*)   ? AST 53 (*)   ? ALT 60 (*)   ? All other components within normal limits  ?CBG MONITORING, ED - Abnormal; Notable for the following components:  ? Glucose-Capillary 105 (*)   ? All other components within normal limits  ?I-STAT CHEM 8, ED - Abnormal; Notable for the following components:  ? Glucose, Bld 134 (*)   ? Calcium, Ion 1.08 (*)   ? Hemoglobin 11.6 (*)   ? HCT 34.0 (*)   ? All other components within normal limits  ?PROTIME-INR  ?HIV ANTIBODY (ROUTINE TESTING W REFLEX)  ?HEMOGLOBIN A1C  ?LIPID PANEL  ?I-STAT BETA HCG BLOOD, ED ([facility], [facility], [facility] ONLY)  ?CBG MONITORING, ED  ?TROPONIN I (HIGH SENSITIVITY)  ?TROPONIN I (HIGH SENSITIVITY)  ? ? ?EKG ?EKG Interpretation ? ?Date/Time:  Tuesday March 16 2022 09:45:00 EDT ?Ventricular Rate:  79 ?PR Interval:  155 ?QRS Duration: 105 ?QT Interval:  416 ?QTC Calculation: 477 ?R Axis:   31 ?Text Interpretation: Sinus rhythm Low voltage, precordial leads Anterior injury pattern Abnormal ECG Confirmed by Carmin Muskrat [phone]) on 03/16/2022 9:54:12 AM ? ?Radiology ?ECHOCARDIOGRAM COMPLETE ? ?Result Date: 03/16/2022 ?   ECHOCARDIOGRAM REPORT   Patient Name:   Shirley Cameron [facility] Date of Exam: 03/16/2022 Medical Rec #:  [id number]         Height:       66.0 in Accession #:    [id number]        Weight:       194.4 lb Date of Birth:  Jun 15, 1971        BSA:          1.976 m? Patient Age:    51 years          BP:           124/100 mmHg Patient Gender: F                 HR:           76 bpm. Exam Location:  Inpatient Procedure: 2D Echo, Cardiac Doppler and Color Doppler Indications:    Stroke  History:        Patient has no prior history of Echocardiogram examinations.                  Risk Factors:Hypertension. GERD.  Sonographer:    Arnell Sieving  Danelle Berry (AE) Referring Phys: [id number] Derek Jack  Sonographer Comments: Suboptimal parasternal window and suboptimal subcostal window. IMPRESSIONS  1. Very poor study with difficult windows for echo.  2. Left ventricular ejection fraction, by estimation, is 60 to 65%. The left ventricle has normal function. The left ventricle has no regional wall motion abnormalities. Left ventricular diastolic parameters are consistent with Grade I diastolic dysfunction (impaired relaxation).  3. Right ventricular systolic function is normal. The right ventricular size is normal. Tricuspid regurgitation signal is inadequate for assessing PA pressure.  4. The mitral valve is grossly normal. Trivial mitral valve regurgitation. No evidence of mitral stenosis.  5. The aortic valve was not well visualized. Aortic valve regurgitation is not visualized. No aortic stenosis is present. Conclusion(s)/Recommendation(s): No intracardiac source of embolism detected on this transthoracic study. Consider a transesophageal echocardiogram to exclude cardiac source of embolism if clinically indicated. FINDINGS  Left Ventricle: Left ventricular ejection fraction, by estimation, is 60 to 65%. The left ventricle has normal function. The left ventricle has no regional wall motion abnormalities. The left ventricular internal cavity size was normal in size. There is  no left ventricular hypertrophy. Left ventricular diastolic parameters are consistent with Grade I diastolic dysfunction (impaired relaxation). Right Ventricle: The right ventricular size is normal. No increase in right ventricular wall thickness. Right ventricular systolic function is normal. Tricuspid regurgitation signal is inadequate for assessing PA pressure. Left Atrium: Left atrial size was normal in size. Right Atrium: Right atrial size was normal in size. Pericardium: Trivial pericardial effusion is present. Presence of  epicardial fat layer. Mitral Valve: The mitral valve is grossly normal. Trivial mitral valve regurgitation. No evidence of mitral valve stenosis. Tricuspid Valve: The tricuspid valve is grossly normal. Tricuspid

## 2022-03-16 NOTE — ED Notes (Signed)
Put patient on the bed pan patient had a bowel movement patient is now off the bed pan with family at bedside and call bell in reach  ?

## 2022-03-16 NOTE — Progress Notes (Signed)
PT Cancellation Note ? ?Patient Details ?Name: Shirley Cameron ?MRN: [id number] ?DOB: [birth year] ? ? ?Cancelled Treatment:    Reason Eval/Treat Not Completed: Active bedrest order; will follow and await upgrade in activity orders.  ? ? ?Reginia Naas ?03/16/2022, 11:10 AM ?Magda Kiel, PT ?Acute Rehabilitation Services ?SRPRX:[phone] ?Office:[phone] ?03/16/2022 ? ?

## 2022-03-16 NOTE — ED Notes (Signed)
Pt husband is at bedside. She has had multiple clear emesis episodes since admission. It is mucous in nature. She is tearful and scared. She c/o mid chest pain. Pt BP remains stable at this time.  ?

## 2022-03-16 NOTE — Progress Notes (Signed)
?  Transition of Care (TOC) Screening Note ? ? ?Patient Details  ?Name: Shirley Cameron ?Date of Birth: Oct 27, 1971 ? ? ?Transition of Care (TOC) CM/SW Contact:    ?Benard Halsted, LCSW ?Phone Number: ?03/16/2022, 11:03 AM ? ? ? ?Transition of Care Department [facility]) has reviewed patient and no TOC needs have been identified at this time. We will continue to monitor patient advancement through interdisciplinary progression rounds. If new patient transition needs arise, please place a TOC consult. ? ? ?

## 2022-03-16 NOTE — Progress Notes (Signed)
?  Echocardiogram ?2D Echocardiogram has been performed. ? ?Shirley Cameron ?03/16/2022, 4:08 PM ?

## 2022-03-16 NOTE — Progress Notes (Signed)
PHARMACIST CODE STROKE RESPONSE ? ?Notified to mix TNK at 0902 by Dr. Quinn Axe ?Delivered TNK to RN at [phone] ? ?TNK dose = 22 mg IV over 5 seconds ? ?Issues/delays encountered (if applicable): Given at 8850. DBP above cut off - labetalol x 3 doses total and clevidipine infusion started and escalated as appropriate based on automatic and manual BPs. Manual BP 150/90 at 0917 obtained by RN and tenecteplase administered.  ? ?Cristela Felt, PharmD, BCPS ?Clinical Pharmacist ?03/16/2022 9:22 AM ? ? ?

## 2022-03-16 NOTE — Code Documentation (Signed)
Stroke Response Nurse Documentation ?Code Documentation ? ?Shirley Cameron is a 51 y.o. female arriving to [place]  via [facility] on 03/16/2022 with past medical hx of hypertension, anxiety, headache, GERD, depression, anemia, PTSD, oral and cervical cancer,and asthma. Confirmed not taking Eliquis anymore. Code stroke was activated by EMS.  ? ?Patient from work where she was LKW at 0800 and now complaining of left blurred vision,left sided weakness and nausea after driving to work. 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 Has episodes of nausea related to this baseline.  ? ?Stroke team at the bedside on patient arrival. Labs drawn and patient cleared for CT by Dr. Vanita Panda. Patient to CT with team. NIHSS 7, see documentation for details and code stroke times. Patient with left hemianopia, left facial droop, left arm weakness, left leg weakness, left decreased sensation, and Visual  neglect on exam. The following imaging was completed:  CT Head and CTA. Patient is a candidate for IV Thrombolytic. DBP elevated. Labetalol given x2 with cleviprex started. Manual BP x2. 0917 manual BP 150/90. TNK given 0917 following BP in range per protocol.  Patient is not not a candidate for IR due to no LVO.  ? ?Care Plan: admit to ICU, follow TNK orders/protocol, MRI and aspirin by midnight tomorrow based on post 24 hour scan results.  ? ?Bedside handoff with ED RN Myriam Jacobson.   ? ?Leverne Humbles ?Stroke Response RN ? ? ?

## 2022-03-17 ENCOUNTER — Inpatient Hospital Stay (HOSPITAL_COMMUNITY): Payer: BC Managed Care – PPO

## 2022-03-17 DIAGNOSIS — I639 Cerebral infarction, unspecified: Secondary | ICD-10-CM | POA: Diagnosis not present

## 2022-03-17 LAB — HEMOGLOBIN A1C
Hgb A1c MFr Bld: 5.1 % (ref 4.8–5.6)
Mean Plasma Glucose: 99.67 mg/dL

## 2022-03-17 LAB — LIPID PANEL
Cholesterol: 173 mg/dL (ref 0–200)
HDL: 68 mg/dL (ref >40)
LDL Cholesterol: 87 mg/dL (ref 0–99)
Total CHOL/HDL Ratio: 2.5 RATIO
Triglycerides: 89 mg/dL (ref <150)
VLDL: 18 mg/dL (ref 0–40)

## 2022-03-17 MED ORDER — PRAVASTATIN SODIUM 40 MG PO TABS: 40.0000 mg | ORAL_TABLET | Freq: Every day | ORAL | 2 refills | Status: AC

## 2022-03-17 MED ORDER — PRAVASTATIN SODIUM 40 MG PO TABS: 40.0000 mg | ORAL_TABLET | Freq: Every day | ORAL | Status: DC

## 2022-03-17 MED ORDER — ASPIRIN 81 MG PO TBEC
81.0000 mg | DELAYED_RELEASE_TABLET | Freq: Every day | ORAL | 11 refills | Status: DC
Start: 2022-03-18 — End: 2022-08-11

## 2022-03-17 MED ORDER — PRAVASTATIN SODIUM 10 MG PO TABS: 20.0000 mg | ORAL_TABLET | Freq: Every day | ORAL | Status: DC

## 2022-03-17 MED ORDER — ASPIRIN EC 81 MG PO TBEC
81.0000 mg | DELAYED_RELEASE_TABLET | Freq: Every day | ORAL | Status: DC
  Administered 2022-03-17: 81 mg via ORAL
  Filled 2022-03-17: qty 1

## 2022-03-17 MED ORDER — LORAZEPAM 2 MG/ML IJ SOLN
1.0000 mg | Freq: Once | INTRAMUSCULAR | Status: AC | PRN
  Administered 2022-03-17: 1 mg via INTRAVENOUS
  Filled 2022-03-17: qty 1

## 2022-03-17 MED ORDER — CLOPIDOGREL BISULFATE 75 MG PO TABS: 75.0000 mg | ORAL_TABLET | Freq: Every day | ORAL | 1 refills | Status: DC

## 2022-03-17 MED ORDER — CLOPIDOGREL BISULFATE 75 MG PO TABS
75.0000 mg | ORAL_TABLET | Freq: Every day | ORAL | Status: DC
  Administered 2022-03-17: 75 mg via ORAL
  Filled 2022-03-17: qty 1

## 2022-03-17 NOTE — Discharge Summary (Addendum)
Stroke Discharge Summary  ?Patient ID: Shirley Cameron    l ?  MRN: [id number]    ?  DOB: April 19, 1971 ? ?Date of Admission: 03/16/2022 ?Date of Discharge: 03/17/2022 ? ?Attending Physician:  Antony Contras MD ?Consultant(s): None ?Patient's PCP:  Cyndi Bender, PA-C ? 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  Stroke (cerebrum) ([place]) ? ?Other Stroke Risk Factors ?HTN, HLD ?Obesity, Body mass index is 31.38 kg/m?., BMI >/= 30 associated with increased stroke risk, recommend weight loss, diet and exercise as appropriate  ?Hx stroke/TIA ?Age-indeterminate insult noted on previous CT scan ? ?Allergies as of 03/17/2022   ? ?   Reactions  ? Nsaids   ? H/o gastric bypass  ? Penicillins Hives  ? Cephalexin Itching, Rash  ? Sulfamethoxazole-trimethoprim Itching, Rash  ? ?  ? ?  ?Medication List  ?  ? ?STOP taking these medications   ? ?apixaban 2.5 MG Tabs tablet ?Commonly known as: Eliquis ?  ? ?  ? ?TAKE these medications   ? ?aspirin 81 MG EC tablet ?Take 1 tablet (81 mg total) by mouth daily. Swallow whole. ?Start taking on: March 18, 2022 ?  ?clopidogrel 75 MG tablet ?Commonly known as: PLAVIX ?Take 1 tablet (75 mg total) by mouth daily. ?Start taking on: March 18, 2022 ?  ?hydrOXYzine 50 MG tablet ?Commonly known as: ATARAX ?Take 50 mg by mouth 3 (three) times daily as needed for itching. ?  ?lisinopril-hydrochlorothiazide 20-12.5 MG tablet ?Commonly known as: ZESTORETIC ?Take 1 tablet by mouth daily. ?  ?ondansetron 8 MG tablet ?Commonly known as: ZOFRAN ?Take 8 mg by mouth every 8 (eight) hours as needed for nausea or vomiting. ?  ?pantoprazole 40 MG tablet ?Commonly known as: PROTONIX ?Take 40 mg by mouth daily as needed (acid reflux). ?  ?pravastatin 40 MG tablet ?Commonly known as: PRAVACHOL ?Take 1 tablet (40 mg total) by mouth daily at 6 PM. ?  ?promethazine 25 MG tablet ?Commonly known  as: PHENERGAN ?Take 25 mg by mouth every 6 (six) hours as needed for nausea or vomiting. ?  ?propranolol 80 MG tablet ?Commonly known as: INDERAL ?Take 120 mg by mouth 2 (two) times daily. Taking 1 1/2 tablet BID ?  ?tiZANidine 4 MG tablet ?Commonly known as: ZANAFLEX ?Take 4 mg by mouth every 8 (eight) hours as needed for muscle spasms. ?  ? ?  ? ? ?LABORATORY STUDIES ?CBC ?   ?Component Value Date/Time  ? WBC 6.3 03/16/2022 0857  ? RBC 3.83 (L) 03/16/2022 0857  ? HGB 11.6 (L) 03/16/2022 0858  ? HCT 34.0 (L) 03/16/2022 0858  ? PLT 414 (H) 03/16/2022 0857  ? MCV 86.4 03/16/2022 0857  ? MCH 27.4 03/16/2022 0857  ? MCHC 31.7 03/16/2022 0857  ? RDW 13.3 03/16/2022 0857  ? LYMPHSABS 1.9 03/16/2022 0857  ? MONOABS 0.6 03/16/2022 0857  ? EOSABS 0.6 (H) 03/16/2022 0857  ? BASOSABS 0.1 03/16/2022 0857  ? ?CMP ?   ?Component Value Date/Time  ? NA 136 03/16/2022 0858  ? K 4.3 03/16/2022 0858  ? CL 102 03/16/2022 0858  ? CO2 23 03/16/2022 0857  ? GLUCOSE 134 (H) 03/16/2022 0858  ? BUN 19 03/16/2022 0858  ? CREATININE 0.90 03/16/2022 0858  ? CALCIUM 9.3 03/16/2022 0857  ? PROT 7.0 03/16/2022 0857  ? ALBUMIN 4.0 03/16/2022 0857  ? AST 53 (H) 03/16/2022 0857  ? ALT  60 (H) 03/16/2022 0857  ? ALKPHOS 92 03/16/2022 0857  ? BILITOT 0.8 03/16/2022 0857  ? GFRNONAA >60 03/16/2022 0857  ? ?COAGS ?Lab Results  ?Component Value Date  ? INR 0.9 03/16/2022  ? INR 0.9 11/24/2021  ? ?Lipid Panel ?   ?Component Value Date/Time  ? CHOL 173 03/17/2022 0057  ? TRIG 89 03/17/2022 0057  ? HDL 68 03/17/2022 0057  ? CHOLHDL 2.5 03/17/2022 0057  ? VLDL 18 03/17/2022 0057  ? [place] 87 03/17/2022 0057  ? ?HgbA1C  ?Lab Results  ?Component Value Date  ? HGBA1C 5.1 03/17/2022  ? ?Urinalysis ?No results found for: COLORURINE, APPEARANCEUR, [place], [place], [place], [place], [place], KETONESUR, PROTEINUR, [place], NITRITE, LEUKOCYTESUR ?Urine Drug Screen No results found for: LABOPIA, COCAINSCRNUR, [place], [place], THCU, LABBARB  ?Alcohol  Level ?No results found for: ETH ? ? ?SIGNIFICANT DIAGNOSTIC STUDIES ?MR BRAIN WO CONTRAST ? ?Result Date: 03/17/2022 ?CLINICAL DATA:  51 year old female code stroke presentation yesterday. Vertigo and left side weakness. Indeterminate 4 mm left frontal lobe white matter lesion on CT. EXAM: MRI HEAD WITHOUT CONTRAST TECHNIQUE: Multiplanar, multiecho pulse sequences of the brain and surrounding structures were obtained without intravenous contrast. COMPARISON:  Head CT and CTA head and neck yesterday. FINDINGS: Brain: No restricted diffusion to suggest acute infarction. No midline shift, mass effect, evidence of mass lesion, ventriculomegaly, extra-axial collection or acute intracranial hemorrhage. Cervicomedullary junction and pituitary are within normal limits. Scattered mostly subcortical white matter T2 and FLAIR hyperintensity, including a focus on series 5, image 23 corresponding to the plain CT finding yesterday. These are nonspecific and mildly to moderately advanced for age. No superimposed cortical encephalomalacia. No chronic cerebral blood products on T2 * imaging. Deep gray nuclei, brainstem and cerebellum appear negative. Vascular: Major intracranial vascular flow voids are preserved. Dominant distal right vertebral artery as on CTA. Skull and upper cervical spine: Negative visible cervical spine. Visualized bone marrow signal is within normal limits. Sinuses/Orbits: Negative orbits. Paranasal Visualized paranasal sinuses and mastoids are stable and well aerated. Other: Visible internal auditory structures appear normal. Normal stylomastoid foramina. Negative visible scalp and face. IMPRESSION: 1. No acute intracranial abnormality. 2. Mild to moderate for age cerebral white matter signal changes, nonspecific but most commonly due to chronic small vessel disease. Electronically Signed   By: Genevie Ann M.D.   On: 03/17/2022 09:25  ? ?ECHOCARDIOGRAM COMPLETE ? ?Result Date: 03/16/2022 ?   ECHOCARDIOGRAM REPORT    Patient Name:   Shirley Cameron [facility] Date of Exam: 03/16/2022 Medical Rec #:  [id number]         Height:       66.0 in Accession #:    [id number]        Weight:       194.4 lb Date of Birth:  1971-07-10        BSA:          1.976 m? Patient Age:    33 years          BP:           124/100 mmHg Patient Gender: F                 HR:           76 bpm. Exam Location:  Inpatient Procedure: 2D Echo, Cardiac Doppler and Color Doppler Indications:    Stroke  History:        Patient has no prior history of Echocardiogram examinations.  Risk Factors:Hypertension. GERD.  Sonographer:    Clayton Lefort RDCS (AE) Referring Phys: [id number] Patrecia Pour STACK  Sonographer Comments: Suboptimal parasternal window and suboptimal subcostal window. IMPRESSIONS  1. Very poor study with difficult windows for echo.  2. Left ventricular ejection fraction, by estimation, is 60 to 65%. The left ventricle has normal function. The left ventricle has no regional wall motion abnormalities. Left ventricular diastolic parameters are consistent with Grade I diastolic dysfunction (impaired relaxation).  3. Right ventricular systolic function is normal. The right ventricular size is normal. Tricuspid regurgitation signal is inadequate for assessing PA pressure.  4. The mitral valve is grossly normal. Trivial mitral valve regurgitation. No evidence of mitral stenosis.  5. The aortic valve was not well visualized. Aortic valve regurgitation is not visualized. No aortic stenosis is present. Conclusion(s)/Recommendation(s): No intracardiac source of embolism detected on this transthoracic study. Consider a transesophageal echocardiogram to exclude cardiac source of embolism if clinically indicated. FINDINGS  Left Ventricle: Left ventricular ejection fraction, by estimation, is 60 to 65%. The left ventricle has normal function. The left ventricle has no regional wall motion abnormalities. The left ventricular internal cavity size was normal in size. There  is  no left ventricular hypertrophy. Left ventricular diastolic parameters are consistent with Grade I diastolic dysfunction (impaired relaxation). Right Ventricle: The right ventricular size is normal. No i

## 2022-03-17 NOTE — TOC CAGE-AID Note (Signed)
Transition of Care (TOC) - CAGE-AID Screening ? ? ?Patient Details  ?Name: Shirley Cameron ?MRN: [id number] ?Date of Birth: 03/27/1971 ? ?Transition of Care (TOC) CM/SW Contact:    ?Akyah Lagrange C Tarpley-Carter, LCSWA ?Phone Number: ?03/17/2022, 8:19 AM ? ? ?Clinical Narrative: ?Pt participated in [place].  Per pts husband, she does not use substance or ETOH.  Pt was not offered resources, due to no usage of substance or ETOH.    ? ?Passenger transport manager, MSW, LCSW-A ?Pronouns:  She/Her/Hers ?[facility] HealthTransitions of Care ?Clinical Social Worker ?Direct Number:  [phone] ?[email or web address] ? ?CAGE-AID Screening: ?  ? ?Have You Ever Felt You Ought to Cut Down on Your Drinking or Drug Use?: No ?Have People Annoyed You By Critizing Your Drinking Or Drug Use?: No ?Have You Felt Bad Or Guilty About Your Drinking Or Drug Use?: No ?Have You Ever Had a Drink or Used Drugs First Thing In The Morning to Steady Your Nerves or to Get Rid of a Hangover?: No ?CAGE-AID Score: 0 ? ?Substance Abuse Education Offered: No ? ?  ? ? ? ? ? ? ?

## 2022-03-17 NOTE — Evaluation (Signed)
Speech Language Pathology Evaluation ?Patient Details ?Name: Shirley Cameron ?MRN: [id number] ?DOB: Aug 12, 1971 ?Today's Date: 03/17/2022 ?Time: 1856-3149 ?SLP Time Calculation (min) (ACUTE ONLY): 16 min ? ?Problem List:  ?Patient Active Problem List  ? Diagnosis Date Noted  ? 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 Localized osteoarthritis of right knee 12/07/2021  ? ?Past Medical History:  ?Past Medical History:  ?Diagnosis Date  ? Anemia   ? Anxiety   ? Arthritis   ? Asthma   ? Cancer [facility])   ? oral and cervical  ? Complication of anesthesia   ? very slow/ hard to wake up and usually has itching  ? Depression   ? Elevated liver enzymes   ? GERD (gastroesophageal reflux disease)   ? Headache   ? Hypertension   ? Pneumonia   ? PTSD (post-traumatic stress disorder)   ? ?Past Surgical History:  ?Past Surgical History:  ?Procedure Laterality Date  ? ABDOMINAL HYSTERECTOMY    ? ARTHROSCOPIC REPAIR ACL    ? BREAST SURGERY    ? CHOLECYSTECTOMY    ? CHONDROPLASTY Right 10/18/2019  ? Procedure: CHONDROPLASTY;  Surgeon: Hiram Gash, MD;  Location: [place];  Service: Orthopedics;  Laterality: Right;  ? EXPLORATORY LAPAROTOMY    ? FRACTURE SURGERY    ? ankle  ? GASTRIC ROUX-EN-Y  12/19/2008  ? HARDWARE REMOVAL    ? KNEE ARTHROSCOPY WITH LATERAL MENISECTOMY Right 10/18/2019  ? Procedure: KNEE ARTHROSCOPY WITH LATERAL MENISECTOMY;  Surgeon: Hiram Gash, MD;  Location: [place];  Service: Orthopedics;  Laterality: Right;  ? OSTEOTOMY AND TIBIAL SHORTENING    ? TONSILLECTOMY    ? TOTAL KNEE ARTHROPLASTY Right 12/07/2021  ? Procedure: TOTAL KNEE ARTHROPLASTY;  Surgeon: Willaim Sheng, MD;  Location: [facility];  Service: Orthopedics;  Laterality: Right;  ? WISDOM TOOTH EXTRACTION    ? ?HPI:  ?Pt is a 51 year old female who presented with acute onset of vertigo and left-sided weakness. MRI brain negative. TNK given. PMH: asthma, oral and cervical cancer, depression, headache, hypertension,  PTSD.  ? ?Assessment / Plan / Recommendation ?Clinical Impression ? Pt participated in speech-language-cognition evaluation. 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 Pt reported confusion and slurred speech at the onset of her symptoms. She stated that her speech is back to baseline, but reported following the evaluation that she believed her thinking was "slower" and that her "focus" was off. Per the pt, she did not sleep well last night; the impact of this on her performance is considered. Motor speech and language skills were [facility]. [facility] Mental Status Examination was completed to evaluate the pt's cognitive-linguistic skills. She achieved a score of 25/30 which is below the normal limits of 27 or more out of 30 and is suggestive of a mild impairment. She exhibited difficulty in the areas of sustained attention, memory, complex problem solving, and executive function. Skilled SLP services are clinically indicated at this time to improve cognitive-linguistic function. ?   ?SLP Assessment ? SLP Recommendation/Assessment: Patient needs continued [place] [facility] ?SLP Visit Diagnosis: Cognitive communication deficit (R41.841)  ?  ?Recommendations for follow up therapy are one component of a multi-disciplinary discharge planning process, led by the attending physician.  Recommendations may be updated based on patient status, additional functional criteria and insurance authorization. ?   ?Follow Up Recommendations ? Outpatient SLP (or at level of care recommended  by PT/OT)  ?  ?Assistance Recommended at Discharge ?    ?Functional Status Assessment Patient has had a recent decline in their functional status and demonstrates the ability to make significant improvements in function in a reasonable and predictable amount of time.  ?Frequency and Duration min 2x/week  ?2 weeks ?  ?   ?SLP  Evaluation ?Cognition ? Overall Cognitive Status: Impaired/Different from baseline ?Arousal/Alertness: Awake/alert ?Orientation Level: Oriented X4 ?Year: 2023 ?Month: March ?Day of Week: Correct ?Attention: Focused;Sustained ?Focused Attention: Appears intact ?Sustained Attention: Impaired ?Sustained Attention Impairment: Verbal complex ?Memory: Impaired ?Memory Impairment: Retrieval deficit;Decreased short term memory (Immediate: 5/5 with 3 repetitions; delayed: 5/5; paragraph: 6/8) ?Awareness: Appears intact ?Problem Solving: Impaired ?Problem Solving Impairment: Verbal complex (Time: 1/1; money: 0/3) ?Executive Function: Sequencing;Organizing ?Sequencing: Appears intact ?Organizing: Impaired ?Organizing Impairment: Verbal complex (backward digit span: 3/3 with additional processing time.) ?Safety/Judgment: Appears intact  ?  ?   ?Comprehension ? Auditory Comprehension ?Overall Auditory Comprehension: Appears within functional limits for tasks assessed ?Yes/No Questions: Within Functional Limits ?Commands: Within Functional Limits ?Conversation: Complex ?Visual Recognition/Discrimination ?Discrimination: Within Function Limits  ?  ?Expression Expression ?Primary Mode of Expression: Verbal ?Verbal Expression ?Overall Verbal Expression: Appears within functional limits for tasks assessed ?Initiation: No impairment ?Level of Generative/Spontaneous Verbalization: Conversation ?Repetition: No impairment ?Naming: No impairment ?Pragmatics: No impairment   ?Oral / Motor ? Oral Motor/Sensory Function ?Overall Oral Motor/Sensory Function: Within functional limits ?Motor Speech ?Overall Motor Speech: Appears within functional limits for tasks assessed ?Respiration: Within functional limits ?Phonation: Normal ?Resonance: Within functional limits ?Articulation: Within functional limitis ?Intelligibility: Intelligible ?Motor Planning: Witnin functional limits ?Motor Speech Errors: Not applicable   ?        ?Millena Callins I.  Hardin Negus, [place], CCC-SLP ?Acute Rehabilitation Services ?Office number [phone] ?Pager [phone] ? ?Horton Marshall ?03/17/2022, 11:00 AM ? ? ? ?

## 2022-03-17 NOTE — Evaluation (Signed)
Physical Therapy Evaluation ?Patient Details ?Name: Shirley Cameron ?MRN: [id number] ?DOB: 1971/06/03 ?Today's Date: 03/17/2022 ? ?History of Present Illness ? 51 y.o. female presents to [facility] hospital on 03/16/2022 with acute onset vertigo, vision impairment and L weakness.Pt received tNK. PMH includes asthma, oral and cervical cancer, depression, HA, HTN, PTSD.  ?Clinical Impression ? Pt presents to PT with deficits in balance and reports of mild dizziness with head turns when ambulating. PT vestibular assessment negative for symptoms or nystagmus with pursuits, saccades, VOR. Pt is able to correct loss of balance with head turns when ambulating, and reports symptoms to be much less significant than those at the time of admission. PT recommends discharge home when medically ready, no PT follow-up needs.   ?   ? ?Recommendations for follow up therapy are one component of a multi-disciplinary discharge planning process, led by the attending physician.  Recommendations may be updated based on patient status, additional functional criteria and insurance authorization. ? ?Follow Up Recommendations No PT follow up ? ?  ?Assistance Recommended at Discharge PRN  ?Patient can return home with the following ? A little help with walking and/or transfers;A little help with bathing/dressing/bathroom;Assistance with cooking/housework ? ?  ?Equipment Recommendations None recommended by PT  ?Recommendations for Other Services ?    ?  ?Functional Status Assessment Patient has had a recent decline in their functional status and demonstrates the ability to make significant improvements in function in a reasonable and predictable amount of time.  ? ?  ?Precautions / Restrictions Precautions ?Precautions: Fall ?Restrictions ?Weight Bearing Restrictions: No  ? ?  ? ?Mobility ? Bed Mobility ?  ?  ?  ?  ?  ?  ?  ?  ?  ? ?Transfers ?Overall transfer level: Independent ?Equipment used: None ?  ?  ?  ?  ?  ?  ?  ?  ?   ? ?Ambulation/Gait ?Ambulation/Gait assistance: Supervision ?Gait Distance (Feet): 400 Feet ?Assistive device: None ?Gait Pattern/deviations: Step-through pattern, Staggering left ?Gait velocity: functional ?Gait velocity interpretation: 1.31 - 2.62 ft/sec, indicative of limited community ambulator ?  ?General Gait Details: pt with one leftward loss of balance with head turns and one with increase in stride length. Pt otherwise tolerates dynamic gait tasks including abrupt stops, changing gait speed, walking backward, all without loss of balance ? ?Stairs ?Stairs:  (pt declines the need for stair training) ?  ?  ?  ?  ? ?Wheelchair Mobility ?  ? ?Modified Rankin (Stroke Patients Only) ?  ? ?  ? ?Balance Overall balance assessment: Needs assistance ?Sitting-balance support: No upper extremity supported, Feet supported ?Sitting balance-Leahy Scale: Good ?  ?  ?Standing balance support: No upper extremity supported, During functional activity ?Standing balance-Leahy Scale: Good ?  ?  ?  ?  ?  ?  ?  ?  ?  ?  ?  ?  ?   ? ? ? ?Pertinent Vitals/Pain Pain Assessment ?Pain Assessment: Faces ?Faces Pain Scale: Hurts little more ?Pain Location: generalized ?Pain Descriptors / Indicators: Sore ?Pain Intervention(s): Monitored during session  ? ? ?Home Living Family/patient expects to be discharged to:: Private residence ?Living Arrangements: Spouse/significant other ?Available Help at Discharge: Family;Available PRN/intermittently ?Type of Home: House ?Home Access: Stairs to enter ?Entrance Stairs-Rails: Can reach both ?Entrance Stairs-Number of Steps: 2 ?  ?Home Layout: One level ?Home Equipment: Conservation officer, nature (2 wheels);Cane - single point;Crutches ?   ?  ?Prior Function Prior Level of Function : Independent/Modified Independent;Working/employed;Driving ?  ?  ?  ?  ?  ?  ?  ?  ?  ? ? ?  Hand Dominance  ?   ? ?  ?Extremity/Trunk Assessment  ? Upper Extremity Assessment ?Upper Extremity Assessment: Overall WFL for tasks  assessed ?  ? ?Lower Extremity Assessment ?Lower Extremity Assessment: Overall WFL for tasks assessed ?  ? ?Cervical / Trunk Assessment ?Cervical / Trunk Assessment: Normal  ?Communication  ? Communication: No difficulties  ?Cognition Arousal/Alertness: Awake/alert ?Behavior During Therapy: [facility] for tasks assessed/performed ?Overall Cognitive Status: Within Functional Limits for tasks assessed ?  ?  ?  ?  ?  ?  ?  ?  ?  ?  ?  ?  ?  ?  ?  ?  ?  ?  ?  ? ?  ?General Comments General comments (skin integrity, edema, etc.): VSS on RA ? ?  ?Exercises    ? ?Assessment/Plan  ?  ?PT Assessment Patient needs continued PT services  ?PT Problem List Decreased balance ? ?   ?  ?PT Treatment Interventions Gait training;Stair training;Therapeutic activities;Balance training;Neuromuscular re-education;Patient/family education   ? ?PT Goals (Current goals can be found in the Care Plan section)  ?Acute Rehab PT Goals ?Patient Stated Goal: to return home ?PT Goal Formulation: With patient ?Time For Goal Achievement: 03/31/22 ?Potential to Achieve Goals: Good ?Additional Goals ?Additional Goal #1: Pt will score >19/24 on DGI to indicate a reduced risk for falls ? ?  ?Frequency Min 3X/week ?  ? ? ?Co-evaluation   ?  ?  ?  ?  ? ? ?  ?AM-PAC PT "6 Clicks" Mobility  ?Outcome Measure Help needed turning from your back to your side while in a flat bed without using bedrails?: None ?Help needed moving from lying on your back to sitting on the side of a flat bed without using bedrails?: None ?Help needed moving to and from a bed to a chair (including a wheelchair)?: None ?Help needed standing up from a chair using your arms (e.g., wheelchair or bedside chair)?: None ?Help needed to walk in hospital room?: A Little ?Help needed climbing 3-5 steps with a railing? : A Little ?6 Click Score: 22 ? ?  ?End of Session   ?Activity Tolerance: Patient tolerated treatment well ?Patient left: in chair;with call bell/phone within reach ?Nurse Communication:  Mobility status ?PT Visit Diagnosis: Other abnormalities of gait and mobility (R26.89);Dizziness and giddiness (R42) ?  ? ?Time: 5465-0354 ?PT Time Calculation (min) (ACUTE ONLY): 17 min ? ? ?Charges:   PT Evaluation ?$PT Eval Low Complexity: 1 Low ?  ?  ?   ? ? ?Zenaida Niece, PT, DPT ?Acute Rehabilitation ?Pager: [phone] ?Office [phone] ? ? ?Zenaida Niece ?03/17/2022, 11:31 AM ? ?

## 2022-03-18 DIAGNOSIS — E7800 Pure hypercholesterolemia, unspecified: Secondary | ICD-10-CM | POA: Diagnosis not present

## 2022-03-18 DIAGNOSIS — Z8673 Personal history of transient ischemic attack (TIA), and cerebral infarction without residual deficits: Secondary | ICD-10-CM | POA: Diagnosis not present

## 2022-03-18 DIAGNOSIS — I10 Essential (primary) hypertension: Secondary | ICD-10-CM | POA: Diagnosis not present

## 2022-03-18 DIAGNOSIS — I639 Cerebral infarction, unspecified: Secondary | ICD-10-CM

## 2022-03-18 DIAGNOSIS — Z79899 Other long term (current) drug therapy: Secondary | ICD-10-CM | POA: Diagnosis not present

## 2022-03-18 DIAGNOSIS — Z6831 Body mass index (BMI) 31.0-31.9, adult: Secondary | ICD-10-CM | POA: Diagnosis not present

## 2022-03-18 HISTORY — DX: Cerebral infarction, unspecified: I63.9

## 2022-05-18 ENCOUNTER — Inpatient Hospital Stay: Payer: BC Managed Care – PPO | Admitting: Neurology

## 2022-06-01 ENCOUNTER — Other Ambulatory Visit: Payer: Self-pay

## 2022-06-01 NOTE — Patient Outreach (Signed)
[place] [facility]) Care Management  06/01/2022  Shirley Cameron [birth year] [id number]   First telephone outreach attempt to obtain mRS. No answer. Left message for returned call.  Philmore Pali [facility] Management Assistant [phone]

## 2022-06-24 ENCOUNTER — Telehealth: Payer: Self-pay | Admitting: Neurology

## 2022-06-24 NOTE — Telephone Encounter (Signed)
LVM and mychart msg informing pt of need to reschedule 8/7 appointment - MD out

## 2022-06-28 ENCOUNTER — Other Ambulatory Visit: Payer: Self-pay

## 2022-06-28 NOTE — Patient Outreach (Signed)
[place]) Care Management  06/28/2022  NANI INGRAM [birth year] [id number]   Second telephone outreach attempt to obtain mRS. No answer. Left message for returned call.  Philmore Pali [facility] Management Assistant [phone]

## 2022-06-30 ENCOUNTER — Other Ambulatory Visit: Payer: Self-pay

## 2022-06-30 NOTE — Patient Outreach (Signed)
[place] [facility]) Care Management  06/30/2022  Shirley Cameron 01/28/1971 [id number]   3 outreach attempts were completed to obtain mRs. mRs could not be obtained because patient never returned my calls. mRs=7    [place] Management Assistant [phone]

## 2022-07-13 DIAGNOSIS — G4700 Insomnia, unspecified: Secondary | ICD-10-CM | POA: Diagnosis not present

## 2022-07-13 DIAGNOSIS — Z79899 Other long term (current) drug therapy: Secondary | ICD-10-CM | POA: Diagnosis not present

## 2022-07-13 DIAGNOSIS — Z8673 Personal history of transient ischemic attack (TIA), and cerebral infarction without residual deficits: Secondary | ICD-10-CM | POA: Diagnosis not present

## 2022-07-13 DIAGNOSIS — I10 Essential (primary) hypertension: Secondary | ICD-10-CM | POA: Diagnosis not present

## 2022-07-13 DIAGNOSIS — F419 Anxiety disorder, unspecified: Secondary | ICD-10-CM | POA: Diagnosis not present

## 2022-07-26 ENCOUNTER — Inpatient Hospital Stay: Payer: Self-pay | Admitting: Neurology

## 2022-08-11 ENCOUNTER — Encounter: Payer: Self-pay | Admitting: Neurology

## 2022-08-11 ENCOUNTER — Ambulatory Visit: Payer: BC Managed Care – PPO | Admitting: Neurology

## 2022-08-11 VITALS — BP 142/105 | HR 72 | Ht 66.0 in | Wt 202.0 lb

## 2022-08-11 DIAGNOSIS — G43009 Migraine without aura, not intractable, without status migrainosus: Secondary | ICD-10-CM | POA: Diagnosis not present

## 2022-08-11 DIAGNOSIS — R2990 Unspecified symptoms and signs involving the nervous system: Secondary | ICD-10-CM | POA: Diagnosis not present

## 2022-08-11 DIAGNOSIS — G3184 Mild cognitive impairment, so stated: Secondary | ICD-10-CM | POA: Diagnosis not present

## 2022-08-11 NOTE — Patient Instructions (Addendum)
I had a long discussion with the patient regarding strokelike episode in March 2023 as well as recurrent migraine episodes a month later and discussed differential diagnosis TIA versus atypical migraine.  Recommend she continue Plavix for stroke prevention and aggressive risk factor modification with strict control of hypertension with blood pressure goal below 140/90, lipids with LDL cholesterol goal below 70 mg percent and diabetes with hemoglobin A1c goal below 6.5%.  Patient is also having some mild subjective memory and cognitive difficulties I recommend further evaluation with checking memory panel labs as well as EEG.  She was also advised to increase participation in regular activities for stress relaxation.  We also discussed memory compensation strategies.  Follow-up in the future in 6 months with nurse practitioner or call earlier if necessary.  Memory Compensation Strategies  Use "WARM" strategy.  W= write it down  A= associate it  R= repeat it  M= make a mental note  2.   You can keep a Social worker.  Use a 3-ring notebook with sections for the following: calendar, important names and phone numbers,  medications, doctors' names/phone numbers, lists/reminders, and a section to journal what you did  each day.   3.    Use a calendar to write appointments down.  4.    Write yourself a schedule for the day.  This can be placed on the calendar or in a separate section of the Memory Notebook.  Keeping a  regular schedule can help memory.  5.    Use medication organizer with sections for each day or morning/evening pills.  You may need help loading it  6.    Keep a basket, or pegboard by the door.  Place items that you need to take out with you in the basket or on the pegboard.  You may also want to  include a message board for reminders.  7.    Use sticky notes.  Place sticky notes with reminders in a place where the task is performed.  For example: " turn off the  stove" placed by  the stove, "lock the door" placed on the door at eye level, " take your medications" on  the bathroom mirror or by the place where you normally take your medications.  8.    Use alarms/timers.  Use while cooking to remind yourself to check on food or as a reminder to take your medicine, or as a  reminder to make a call, or as a reminder to perform another task, etc.

## 2022-08-11 NOTE — Progress Notes (Signed)
[facility] [address] [place]. [place] [address] [phone]       OFFICE FOLLOW-UP NOTE  Ms. Bascom Levels Date of Birth:  1971/04/13 Medical Record Number:  [id number]   HPI: Ms. Steinke is a 51 year old Caucasian lady seen today for initial office follow-up visit following hospital strokelike episode.She is a 51 year old woman with past medical history significant for asthma, oral and cervical cancer, depression, headache, hypertension, PTSD who presented with acute onset of vertigo and left-sided weakness beginning at 8 AM 03/16/22 morning.  That morning she woke up and drove to work and was completely at her normal baseline.  Beginning at 8 AM she developed subjective vertigo/room spinning and intractable nausea and vomiting.  She was weak on her left side.  She called 911 and it was pre-activated as a stroke code.  On arrival to the ED she was noted to have a stroke scale of 7 for vision impairment in all 4 quadrants left eye, facial droop, weakness in left arm and left leg, left-sided sensory deficit.  Patient was previously CT head was personally reviewed and was unremarkable.  Risks and benefits of TNK were discussed with patient and she elected to proceed.  Administration of TNK was delayed due to difficulty controlling blood pressure particularly her diastolic which required multiple rounds of labetalol and addition of clevidipine gtt.  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  Patient admitted to ICU for post TNK monitoring and further work-up for stroke.Not an interventional candidate 2/2 no LVO. Finished eliquis DVT prophylaxis course in January s/p knee replacement.  She denies any accompanying headache before during or after this episode.  CT head on admission was negative for acute abnormality.  CT angiogram of the head and neck showed hypoplastic posterior circulation and absent  right P1 and PCA.  MRI showed no acute infarct.  2D echo showed ejection fraction 50 to 55%.  LDL cholesterol was 87 mg percent.  Hemoglobin A1c was 5.1.  Patient was on no antithrombotic prior to admission.  Started on aspirin and Plavix for 3 weeks followed by Plavix alone.  Patient states he recovered completely better management: However, months later she had a similar but somewhat milder episode she was at work she became dizzy, disoriented, slightly she was unable to stand from pressure on the face but she was able to keep this time.  Episode lasted about 40 minutes.  She was able to drive home but felt tired and rested For the day.  Patient has remote history of migraine headaches but states has not had a headache for duplicates now.  He is having some mild memory and cognitive difficulties which appear new.  She is in physical therapy 4 times.  She works in [employer] and is having trouble with her walking 50 miles an hour.  She does have a remote history of head injury in 2017 [place] of her [place] and sustained a s right parietal soft tissue hematoma underlying skull fracture.  She remains on Plavix which is tolerating well with only minor bruising and no bleeding episodes.  She is tolerating Pravachol well without muscle aches and pains. ROS:   14 system review of systems is positive for tinnitus, vertigo, speech difficulty, sweating, disorientation all other systems negative  PMH:  Past Medical History:  Diagnosis Date   Anemia    Anxiety    Arthritis  Asthma    Cancer ([place])    oral and cervical   Complication of anesthesia    very slow/ hard to wake up and usually has itching   Depression    Elevated liver enzymes    GERD (gastroesophageal reflux disease)    Headache    Hypertension    Pneumonia    PTSD (post-traumatic stress disorder)     Social History:  Social History   Socioeconomic History   Marital status: Married    Spouse name: Not on  file   Number of children: Not on file   Years of education: Not on file   Highest education level: Not on file  Occupational History   Not on file  Tobacco Use   Smoking status: Never   Smokeless tobacco: Never  Vaping Use   Vaping Use: Never used  Substance and Sexual Activity   Alcohol use: Yes    Comment: rare   Drug use: Never   Sexual activity: Not on file  Other Topics Concern   Not on file  Social History Narrative   Not on file   Social Determinants of Health   Financial Resource Strain: Not on file  Food Insecurity: Not on file  Transportation Needs: Not on file  Physical Activity: Not on file  Stress: Not on file  Social Connections: Not on file  Intimate Partner Violence: Not on file    Medications:   Current Outpatient Medications on File Prior to Visit  Medication Sig Dispense Refill   clopidogrel (PLAVIX) 75 MG tablet Take 1 tablet (75 mg total) by mouth daily. 90 tablet 1   hydrOXYzine (ATARAX) 50 MG tablet Take 50 mg by mouth 3 (three) times daily as needed for itching.     lisinopril-hydrochlorothiazide (ZESTORETIC) 20-12.5 MG tablet Take 1 tablet by mouth daily.     ondansetron (ZOFRAN) 8 MG tablet Take 8 mg by mouth every 8 (eight) hours as needed for nausea or vomiting.     pantoprazole (PROTONIX) 40 MG tablet Take 40 mg by mouth daily as needed (acid reflux).     pravastatin (PRAVACHOL) 40 MG tablet Take 1 tablet (40 mg total) by mouth daily at 6 PM. 30 tablet 2   promethazine (PHENERGAN) 25 MG tablet Take 25 mg by mouth every 6 (six) hours as needed for nausea or vomiting.     propranolol (INDERAL) 80 MG tablet Take 120 mg by mouth 2 (two) times daily. Taking 1 1/2 tablet BID     temazepam (RESTORIL) 15 MG capsule Take 15-30 mg by mouth at bedtime.     No current facility-administered medications on file prior to visit.    Allergies:   Allergies  Allergen Reactions   Nsaids     H/o gastric bypass   Penicillins Hives   Cephalexin Itching and  Rash   Sulfamethoxazole-Trimethoprim Itching and Rash    Physical Exam General: well developed, well nourished pleasant middle-age Caucasian lady, seated, in no evident distress Head: head normocephalic and atraumatic.  Neck: supple with no carotid or supraclavicular bruits Cardiovascular: regular rate and rhythm, no murmurs Musculoskeletal: no deformity Skin:  no rash/petichiae Vascular:  Normal pulses all extremities Vitals:   08/11/22 1519  BP: (!) 142/105  Pulse: 72   Neurologic Exam Mental Status: Awake and fully alert. Oriented to place and time. Recent and remote memory intact. Attention span, concentration and fund of knowledge appropriate. Mood and affect appropriate.  Minute recall 2/3.  Able to name 14 animals that  can walk on 4 legs.  Clock drawing 4/4.  On MoCA testing scored 28/30.  Genetic depression scale is 1 depressed. Cranial Nerves: Fundoscopic exam reveals sharp disc margins. Pupils equal, briskly reactive to light. Extraocular movements full without nystagmus. Visual fields full to confrontation. Hearing intact. Facial sensation intact. Face, tongue, palate moves normally and symmetrically.  Motor: Normal bulk and tone. Normal strength in all tested extremity muscles. Sensory.: intact to touch ,pinprick .position and vibratory sensation.  Coordination: Rapid alternating movements normal in all extremities. Finger-to-nose and heel-to-shin performed accurately bilaterally. Gait and Station: Arises from chair without difficulty. Stance is normal. Gait demonstrates normal stride length and balance . Able to heel, toe and tandem walk without difficulty.  Reflexes: 1+ and symmetric. Toes downgoing.   NIHSS 0 Modified Rankin 1   ASSESSMENT: 51 year old Caucasian lady with recurrent transient episodes of dizziness, vertigo speech difficulties unclear etiology possibly vertebrobasilar TIAs versus atypical migraine episodes.  She also has subjective difficulties likely due  to mild cognitive impairment     PLAN: I had a long discussion with the patient regarding strokelike episode in March 2023 as well as recurrent migraine episodes a month later and discussed differential diagnosis TIA versus atypical migraine.  The episodes are not frequent enough to justify migraine prophylaxis at this time.  Recommend she continue Plavix for stroke prevention and aggressive risk factor modification with strict control of hypertension with blood pressure goal below 140/90, lipids with LDL cholesterol goal below 70 mg percent and diabetes with hemoglobin A1c goal below 6.5%.  Patient is also having some mild subjective memory and cognitive difficulties I recommend further evaluation with checking memory panel labs as well as EEG.  She was also advised to increase participation in regular activities for stress relaxation.  We also discussed memory compensation strategies.  Follow-up in the future in 6 months with nurse practitioner or call earlier if necessary.Greater than 50% of time during this 35 minute visit was spent on counseling,explanation of diagnosis of strokelike episode, typical migraine and mild cognitive impairment, planning of further management, discussion with patient and family and coordination of care Antony Contras, MD Note: This document was prepared with digital dictation and possible smart phrase technology. Any transcriptional errors that result from this process are unintentional

## 2022-08-12 LAB — DEMENTIA PANEL
Homocysteine: 15.7 umol/L — ABNORMAL HIGH (ref 0.0–14.5)
RPR Ser Ql: NONREACTIVE
TSH: 1.7 u[IU]/mL (ref 0.450–4.500)
Vitamin B-12: 219 pg/mL — ABNORMAL LOW (ref 232–1245)

## 2022-08-15 ENCOUNTER — Other Ambulatory Visit: Payer: Self-pay | Admitting: Neurology

## 2022-08-15 MED ORDER — FOLIC ACID 1 MG PO TABS: 1.0000 mg | ORAL_TABLET | Freq: Every day | ORAL | 3 refills | Status: DC

## 2022-08-15 NOTE — Progress Notes (Signed)
Kindly inform the patient that lab work for reversible causes of memory loss showed low vitamin B12 levels and I recommend she see her primary care physician for replacement for this.  Lab work also showed elevated homocystine levels which can affect memory I recommend starting folic acid 1 mg daily to bring this down.

## 2022-08-16 ENCOUNTER — Telehealth: Payer: Self-pay

## 2022-08-16 NOTE — Telephone Encounter (Signed)
-----   Message from Garvin Fila, MD sent at 08/15/2022  3:19 PM EDT ----- Shirley Cameron inform the patient that lab work for reversible causes of memory loss showed low vitamin B12 levels and I recommend she see her primary care physician for replacement for this.  Lab work also showed elevated homocystine levels which can affect memory I recommend starting folic acid 1 mg daily to bring this down.

## 2022-08-16 NOTE — Telephone Encounter (Signed)
Left message for return call.

## 2022-08-16 NOTE — Telephone Encounter (Signed)
I called and spoke with the patient. I informed her of the lab results. She verbalized understanding of the findings and will follow up with PCP for low vitamin B12 levels. She is agreeable to beginning folic acid 1 mg daily. She expressed appreciation for the call.

## 2022-08-26 ENCOUNTER — Ambulatory Visit: Payer: BC Managed Care – PPO | Admitting: Neurology

## 2022-08-26 DIAGNOSIS — R4182 Altered mental status, unspecified: Secondary | ICD-10-CM | POA: Diagnosis not present

## 2022-08-26 DIAGNOSIS — G3184 Mild cognitive impairment, so stated: Secondary | ICD-10-CM

## 2022-08-26 NOTE — Progress Notes (Signed)
Kindly inform the patient that EEG or brainwave study was normal.  No evidence of seizure activity noted

## 2022-09-13 DIAGNOSIS — E538 Deficiency of other specified B group vitamins: Secondary | ICD-10-CM | POA: Diagnosis not present

## 2022-09-13 DIAGNOSIS — I10 Essential (primary) hypertension: Secondary | ICD-10-CM | POA: Diagnosis not present

## 2022-09-13 DIAGNOSIS — K909 Intestinal malabsorption, unspecified: Secondary | ICD-10-CM | POA: Diagnosis not present

## 2022-09-13 DIAGNOSIS — Z8673 Personal history of transient ischemic attack (TIA), and cerebral infarction without residual deficits: Secondary | ICD-10-CM | POA: Diagnosis not present

## 2022-10-18 DIAGNOSIS — Z862 Personal history of diseases of the blood and blood-forming organs and certain disorders involving the immune mechanism: Secondary | ICD-10-CM | POA: Diagnosis not present

## 2022-10-18 DIAGNOSIS — I10 Essential (primary) hypertension: Secondary | ICD-10-CM | POA: Diagnosis not present

## 2022-10-18 DIAGNOSIS — E538 Deficiency of other specified B group vitamins: Secondary | ICD-10-CM | POA: Diagnosis not present

## 2022-10-18 DIAGNOSIS — E7211 Homocystinuria: Secondary | ICD-10-CM | POA: Diagnosis not present

## 2022-11-19 DIAGNOSIS — D509 Iron deficiency anemia, unspecified: Secondary | ICD-10-CM | POA: Diagnosis not present

## 2022-11-26 DIAGNOSIS — D509 Iron deficiency anemia, unspecified: Secondary | ICD-10-CM | POA: Diagnosis not present

## 2022-12-03 DIAGNOSIS — E7211 Homocystinuria: Secondary | ICD-10-CM | POA: Diagnosis not present

## 2022-12-03 DIAGNOSIS — D509 Iron deficiency anemia, unspecified: Secondary | ICD-10-CM | POA: Diagnosis not present

## 2022-12-03 DIAGNOSIS — E538 Deficiency of other specified B group vitamins: Secondary | ICD-10-CM | POA: Diagnosis not present

## 2022-12-03 DIAGNOSIS — K909 Intestinal malabsorption, unspecified: Secondary | ICD-10-CM | POA: Diagnosis not present

## 2022-12-03 DIAGNOSIS — R7989 Other specified abnormal findings of blood chemistry: Secondary | ICD-10-CM | POA: Diagnosis not present

## 2022-12-03 LAB — CBC AND DIFFERENTIAL
HCT: 34 — AB (ref 36–46)
Hemoglobin: 10.7 — AB (ref 12.0–16.0)
Neutrophils Absolute: 3.3
Platelets: 359 10*3/uL (ref 150–400)
WBC: 5.4

## 2022-12-03 LAB — IRON,TIBC AND FERRITIN PANEL
%SAT: 38
Iron: 124
TIBC: 324
UIBC: 200

## 2022-12-03 LAB — CBC: RBC: 3.99 (ref 3.87–5.11)

## 2022-12-16 IMAGING — CT CT EXTREM LOW W/O CM*R*
3 of 4 series · 11 of 34 positions shown, 12 images · non-contrast
Comparison: Right knee and lower leg x-rays dated October 16, 2021.
MRI right knee dated September 20, 2019.

CLINICAL DATA: Chronic right knee pain.

EXAM:
CT OF THE LOWER RIGHT EXTREMITY WITHOUT CONTRAST
TECHNIQUE: Multidetector CT imaging of the right lower extremity was performed
according to the standard protocol.

[Series 5: (person_name) 2.00 br40 s3 soft · axial · 0.51mm/px · z∈[+618,+1586]mm · 3 of 486 slices shown, 4 images (1 of 2)]
[im 1/486  soft-tissue]
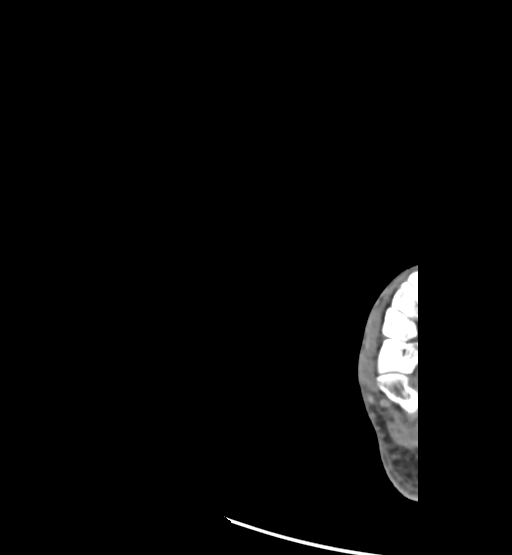
[im 1/486  bone]
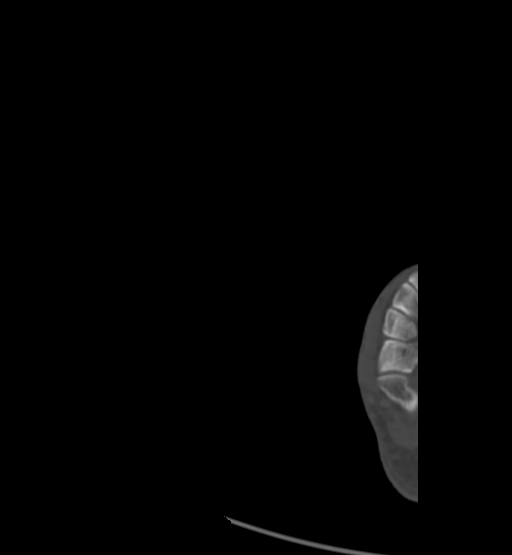
[im 243/486  bone]
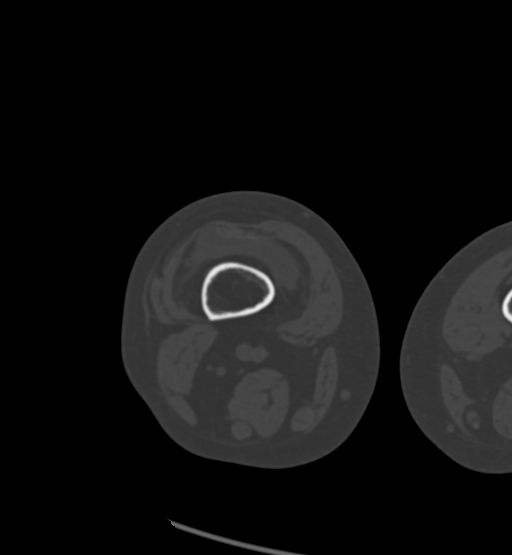
[im 486/486  bone]
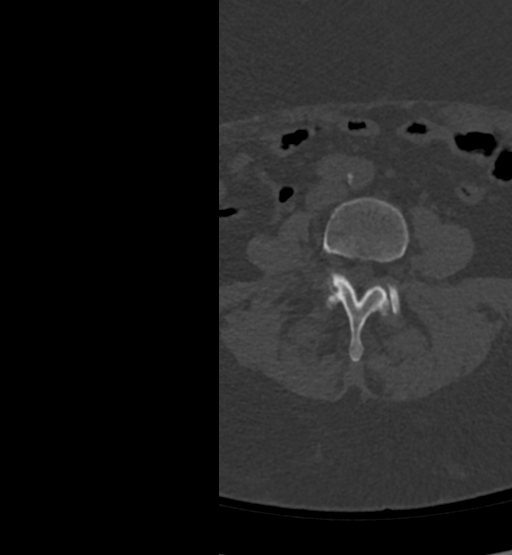

[Series 9: (person_name) 2.00 br40 s3 soft · coronal · 0.54mm/px · 3 of 149 slices shown (2 of 2)]
[im 30/149  bone]
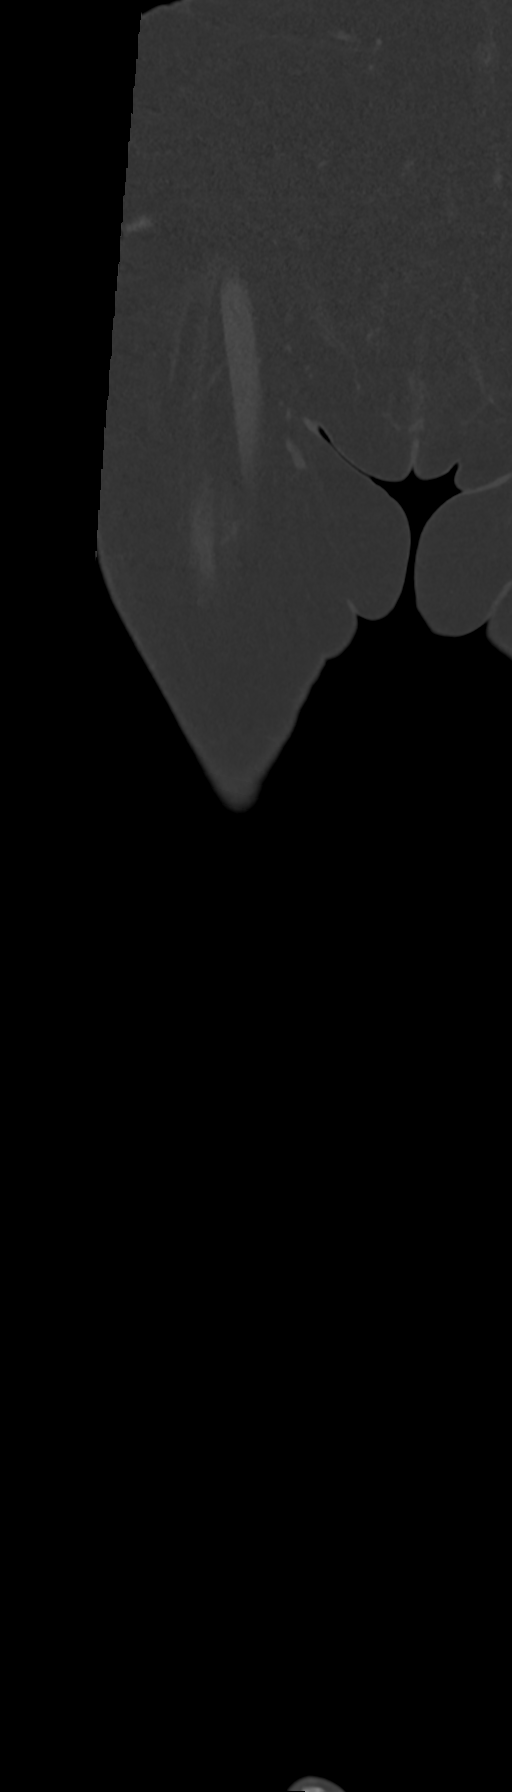
[im 60/149  bone]
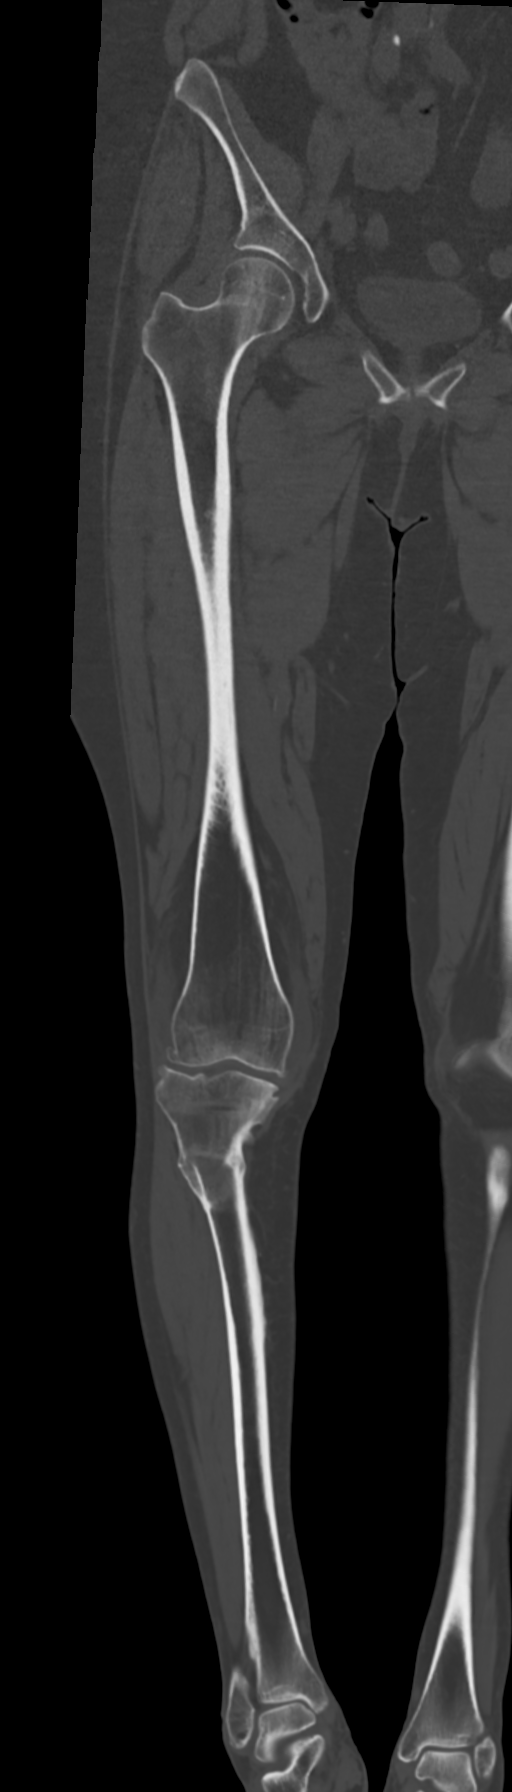
[im 89/149  bone]
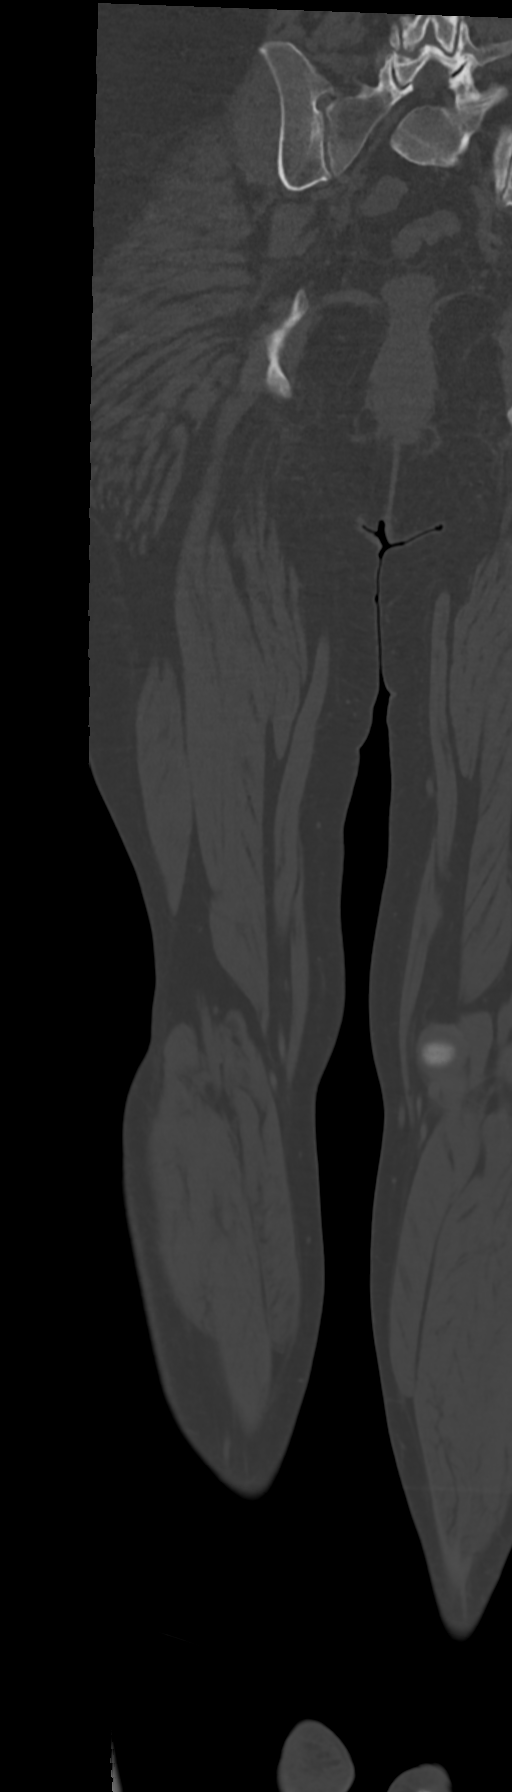

[Series 16: (person_name) 0.60 br40 s3 soft thin · axial · 0.58mm/px · z∈[+703,+1189]mm · 5 of 1620 slices shown]
[im 162/1620  soft-tissue]
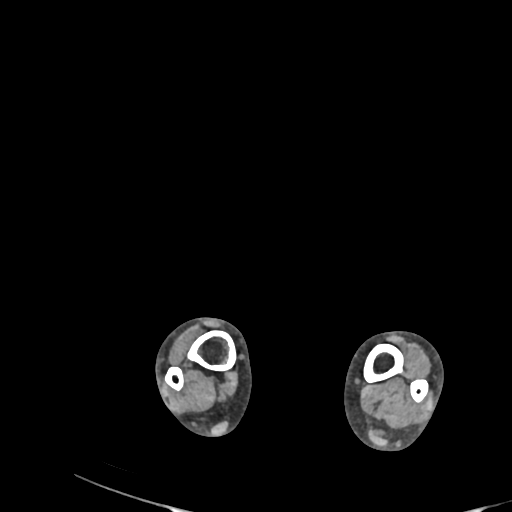
[im 324/1620  soft-tissue]
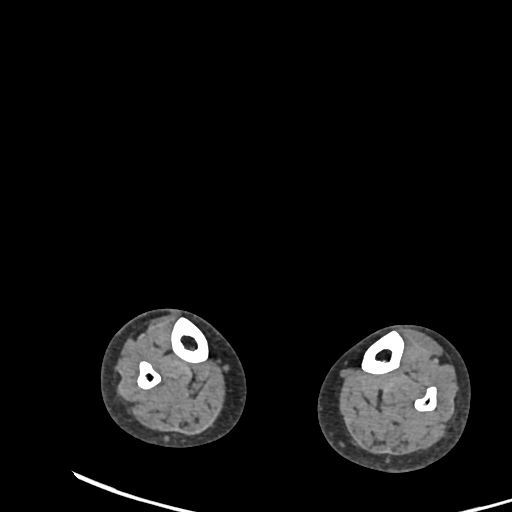
[im 486/1620  soft-tissue]
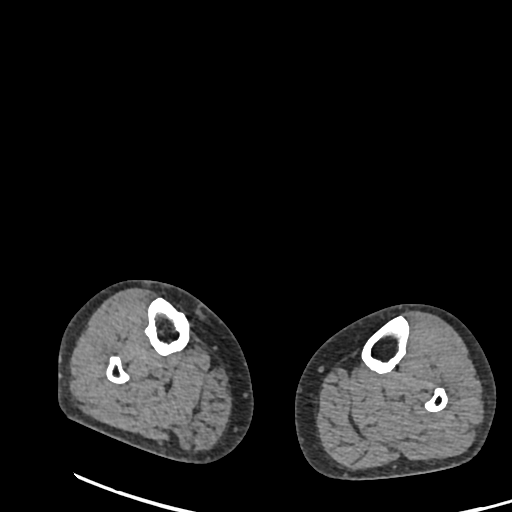
[im 648/1620  soft-tissue]
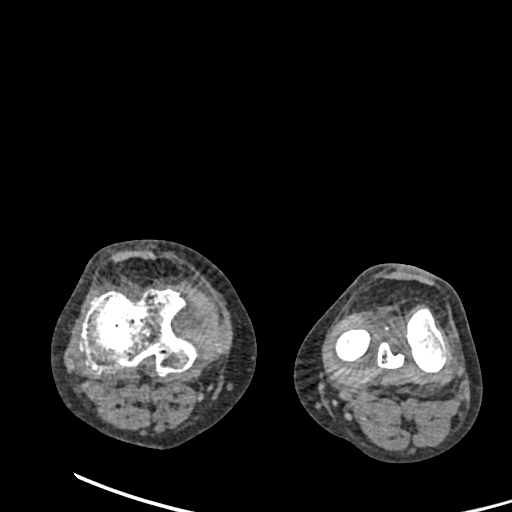
[im 972/1620  soft-tissue]
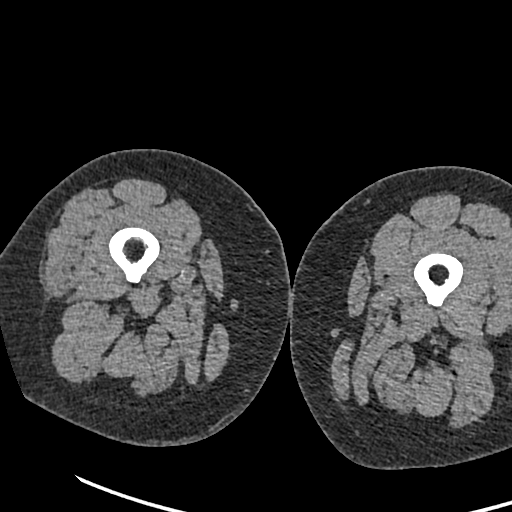

[11 of 34 positions shown; findings below may reference images not displayed]

FINDINGS: Bones/Joint/Cartilage

Old healed osteotomy of the proximal tibial metadiaphysis status
post hardware removal. Prior MCL repair. Tricompartmental joint
space narrowing with marginal osteophytes, moderate in the lateral
compartment. Moderate joint effusion.

The right hip joint space is preserved. Mild degenerative changes of
the right ankle. Degenerative changes of the lower lumbar spine,
right greater than left sacroiliac joints, and pubic symphysis.

Ligaments

Suboptimally assessed by CT.

Muscles and Tendons

Grossly intact.

Soft tissues

No soft tissue mass or fluid collection.

Prior hysterectomy. 3.2 cm simple appearing cyst the left ovary.
cm simple appearing cyst in the right ovary.
IMPRESSION: 1. Moderate tricompartmental knee osteoarthritis.
2. Moderate right knee joint effusion.
3. Old healed proximal tibial osteotomy status post hardware
removal.
4. Bilateral ovarian simple appearing cysts, measuring up to 3.2 cm,
not adequately characterized. Follow-up pelvic ultrasound is
recommended. Reference: JACR [DATE]):[id number]

## 2023-01-17 DIAGNOSIS — U071 COVID-19: Secondary | ICD-10-CM | POA: Diagnosis not present

## 2023-02-11 DIAGNOSIS — D509 Iron deficiency anemia, unspecified: Secondary | ICD-10-CM | POA: Diagnosis not present

## 2023-02-11 DIAGNOSIS — E538 Deficiency of other specified B group vitamins: Secondary | ICD-10-CM | POA: Diagnosis not present

## 2023-02-16 NOTE — Progress Notes (Deleted)
Patient: Shirley Cameron Date of Birth: Mar 11, 1971  Reason for Visit: Follow up History from: Patient Primary Neurologist: Dr. Leonie Man   ASSESSMENT AND PLAN 52 y.o. year old female   ASSESSMENT: 52 year old Caucasian lady with recurrent transient episodes of dizziness, vertigo speech difficulties unclear etiology possibly vertebrobasilar TIAs versus atypical migraine episodes.  She also has subjective difficulties likely due to mild cognitive impairment    HISTORY OF PRESENT ILLNESS: Today 02/16/23   HISTORY  08/11/22 Dr. Leonie Man HPI: Ms. Shirley Cameron is a 52 year old Caucasian lady seen today for initial office follow-up visit following hospital strokelike episode.She is a 52 year old woman with past medical history significant for asthma, oral and cervical cancer, depression, headache, hypertension, PTSD who presented with acute onset of vertigo and left-sided weakness beginning at 8 AM 03/16/22 morning.  That morning she woke up and drove to work and was completely at her normal baseline.  Beginning at 8 AM she developed subjective vertigo/room spinning and intractable nausea and vomiting.  She was weak on her left side.  She called 911 and it was pre-activated as a stroke code.  On arrival to the ED she was noted to have a stroke scale of 7 for vision impairment in all 4 quadrants left eye, facial droop, weakness in left arm and left leg, left-sided sensory deficit.  Patient was previously CT head was personally reviewed and was unremarkable.  Risks and benefits of TNK were discussed with patient and she elected to proceed.  Administration of TNK was delayed due to difficulty controlling blood pressure particularly her diastolic which required multiple rounds of labetalol and addition of clevidipine gtt.  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  Patient admitted to ICU for post TNK monitoring and further work-up for  stroke.Not an interventional candidate 2/2 no LVO. Finished eliquis DVT prophylaxis course in January s/p knee replacement.  She denies any accompanying headache before during or after this episode.  CT head on admission was negative for acute abnormality.  CT angiogram of the head and neck showed hypoplastic posterior circulation and absent right P1 and PCA.  MRI showed no acute infarct.  2D echo showed ejection fraction 50 to 55%.  LDL cholesterol was 87 mg percent.  Hemoglobin A1c was 5.1.  Patient was on no antithrombotic prior to admission.  Started on aspirin and Plavix for 3 weeks followed by Plavix alone.  Patient states he recovered completely better management: However, months later she had a similar but somewhat milder episode she was at work she became dizzy, disoriented, slightly she was unable to stand from pressure on the face but she was able to keep this time.  Episode lasted about 40 minutes.  She was able to drive home but felt tired and rested For the day.  Patient has remote history of migraine headaches but states has not had a headache for duplicates now.  He is having some mild memory and cognitive difficulties which appear new.  She is in physical therapy 4 times.  She works in [employer] and is having trouble with her walking 50 miles an hour.  She does have a remote history of head injury in [place] and sustained a s right parietal soft tissue hematoma underlying skull fracture.  She remains on Plavix which is tolerating well with only minor bruising and no bleeding episodes.  She is tolerating Pravachol  well without muscle aches and pains.   REVIEW OF SYSTEMS: Out of a complete 14 system review of symptoms, the patient complains only of the following symptoms, and all other reviewed systems are negative.  See HPI  ALLERGIES: Allergies  Allergen Reactions   Nsaids     H/o gastric bypass   Penicillins Hives   Cephalexin Itching and  Rash   Sulfamethoxazole-Trimethoprim Itching and Rash    HOME MEDICATIONS: Outpatient Medications Prior to Visit  Medication Sig Dispense Refill   folic acid (FOLVITE) 1 MG tablet Take 1 tablet (1 mg total) by mouth daily. 100 tablet 3   clopidogrel (PLAVIX) 75 MG tablet Take 1 tablet (75 mg total) by mouth daily. 90 tablet 1   hydrOXYzine (ATARAX) 50 MG tablet Take 50 mg by mouth 3 (three) times daily as needed for itching.     lisinopril-hydrochlorothiazide (ZESTORETIC) 20-12.5 MG tablet Take 1 tablet by mouth daily.     ondansetron (ZOFRAN) 8 MG tablet Take 8 mg by mouth every 8 (eight) hours as needed for nausea or vomiting.     pantoprazole (PROTONIX) 40 MG tablet Take 40 mg by mouth daily as needed (acid reflux).     pravastatin (PRAVACHOL) 40 MG tablet Take 1 tablet (40 mg total) by mouth daily at 6 PM. 30 tablet 2   promethazine (PHENERGAN) 25 MG tablet Take 25 mg by mouth every 6 (six) hours as needed for nausea or vomiting.     propranolol (INDERAL) 80 MG tablet Take 120 mg by mouth 2 (two) times daily. Taking 1 1/2 tablet BID     temazepam (RESTORIL) 15 MG capsule Take 15-30 mg by mouth at bedtime.     No facility-administered medications prior to visit.    PAST MEDICAL HISTORY: Past Medical History:  Diagnosis Date   Anemia    Anxiety    Arthritis    Asthma    Cancer ([place])    oral and cervical   Complication of anesthesia    very slow/ hard to wake up and usually has itching   Depression    Elevated liver enzymes    GERD (gastroesophageal reflux disease)    Headache    Hypertension    Pneumonia    PTSD (post-traumatic stress disorder)     PAST SURGICAL HISTORY: Past Surgical History:  Procedure Laterality Date   ABDOMINAL HYSTERECTOMY     ARTHROSCOPIC REPAIR ACL     BREAST SURGERY     CHOLECYSTECTOMY     CHONDROPLASTY Right 10/18/2019   Procedure: CHONDROPLASTY;  Surgeon: Hiram Gash, MD;  Location: [facility];  Service: Orthopedics;   Laterality: Right;   EXPLORATORY LAPAROTOMY     FRACTURE SURGERY     ankle   GASTRIC ROUX-EN-Y  12/19/2008   HARDWARE REMOVAL     KNEE ARTHROSCOPY WITH LATERAL MENISECTOMY Right 10/18/2019   Procedure: KNEE ARTHROSCOPY WITH LATERAL MENISECTOMY;  Surgeon: Hiram Gash, MD;  Location: [facility];  Service: Orthopedics;  Laterality: Right;   OSTEOTOMY AND TIBIAL SHORTENING     TONSILLECTOMY     TOTAL KNEE ARTHROPLASTY Right 12/07/2021   Procedure: TOTAL KNEE ARTHROPLASTY;  Surgeon: Willaim Sheng, MD;  Location: [facility];  Service: Orthopedics;  Laterality: Right;   WISDOM TOOTH EXTRACTION      FAMILY HISTORY: No family history on file.  SOCIAL HISTORY: Social History   Socioeconomic History   Marital status: Married    Spouse name: Not on file  Number of children: Not on file   Years of education: Not on file   Highest education level: Not on file  Occupational History   Not on file  Tobacco Use   Smoking status: Never   Smokeless tobacco: Never  Vaping Use   Vaping Use: Never used  Substance and Sexual Activity   Alcohol use: Yes    Comment: rare   Drug use: Never   Sexual activity: Not on file  Other Topics Concern   Not on file  Social History Narrative   Not on file   Social Determinants of Health   Financial Resource Strain: Not on file  Food Insecurity: Not on file  Transportation Needs: Not on file  Physical Activity: Not on file  Stress: Not on file  Social Connections: Not on file  Intimate Partner Violence: Not on file    PHYSICAL EXAM  There were no vitals filed for this visit. There is no height or weight on file to calculate BMI.  Generalized: Well developed, in no acute distress  Neurological examination  Mentation: Alert oriented to time, place, history taking. Follows all commands speech and language fluent Cranial nerve II-XII: Pupils were equal round reactive to light. Extraocular movements were full, visual field  were full on confrontational test. Facial sensation and strength were normal. Uvula tongue midline. Head turning and shoulder shrug  were normal and symmetric. Motor: The motor testing reveals 5 over 5 strength of all 4 extremities. Good symmetric motor tone is noted throughout.  Sensory: Sensory testing is intact to soft touch on all 4 extremities. No evidence of extinction is noted.  Coordination: Cerebellar testing reveals good finger-nose-finger and heel-to-shin bilaterally.  Gait and station: Gait is normal. Tandem gait is normal. Romberg is negative. No drift is seen.  Reflexes: Deep tendon reflexes are symmetric and normal bilaterally.   DIAGNOSTIC DATA (LABS, IMAGING, TESTING) - I reviewed patient records, labs, notes, testing and imaging myself where available.  Lab Results  Component Value Date   WBC 6.3 03/16/2022   HGB 11.6 (L) 03/16/2022   HCT 34.0 (L) 03/16/2022   MCV 86.4 03/16/2022   PLT 414 (H) 03/16/2022      Component Value Date/Time   NA 136 03/16/2022 0858   K 4.3 03/16/2022 0858   CL 102 03/16/2022 0858   CO2 23 03/16/2022 0857   GLUCOSE 134 (H) 03/16/2022 0858   BUN 19 03/16/2022 0858   CREATININE 0.90 03/16/2022 0858   CALCIUM 9.3 03/16/2022 0857   PROT 7.0 03/16/2022 0857   ALBUMIN 4.0 03/16/2022 0857   AST 53 (H) 03/16/2022 0857   ALT 60 (H) 03/16/2022 0857   ALKPHOS 92 03/16/2022 0857   BILITOT 0.8 03/16/2022 0857   GFRNONAA >60 03/16/2022 0857   Lab Results  Component Value Date   CHOL 173 03/17/2022   HDL 68 03/17/2022   LDLCALC 87 03/17/2022   TRIG 89 03/17/2022   CHOLHDL 2.5 03/17/2022   Lab Results  Component Value Date   HGBA1C 5.1 03/17/2022   Lab Results  Component Value Date   VITAMINB12 219 (L) 08/11/2022   Lab Results  Component Value Date   TSH 1.700 08/11/2022    Butler Denmark, AGNP-C, DNP 02/16/2023, 3:30 PM [facility] [address] [place], [place] [address] [phone]

## 2023-02-17 ENCOUNTER — Encounter: Payer: Self-pay | Admitting: Neurology

## 2023-02-17 ENCOUNTER — Ambulatory Visit: Payer: BC Managed Care – PPO | Admitting: Neurology

## 2023-05-25 ENCOUNTER — Telehealth: Payer: Self-pay | Admitting: Physician Assistant

## 2023-05-25 NOTE — Telephone Encounter (Signed)
Called pt schedule an appt for 4. Pt verbalized understanding.  KP

## 2023-05-25 NOTE — Telephone Encounter (Signed)
Pt is calling in because per pt she was a former pt's of Alfonso Ellis before he switched locations. Pt made a new pt appointment to re-establish care. Pt says she takes blood pressure medication and will run out of medication before her appointment and wants to know is it possible for them to refilled by [name] prior to her appointment or would she need to wait? Please advise.

## 2023-05-26 ENCOUNTER — Other Ambulatory Visit: Payer: Self-pay

## 2023-05-26 ENCOUNTER — Ambulatory Visit: Payer: BC Managed Care – PPO | Admitting: Physician Assistant

## 2023-05-26 ENCOUNTER — Encounter: Payer: Self-pay | Admitting: Physician Assistant

## 2023-05-26 VITALS — BP 130/100 | HR 75 | Temp 98.4°F | Ht 66.0 in | Wt 218.0 lb

## 2023-05-26 DIAGNOSIS — K580 Irritable bowel syndrome with diarrhea: Secondary | ICD-10-CM

## 2023-05-26 DIAGNOSIS — R110 Nausea: Secondary | ICD-10-CM | POA: Diagnosis not present

## 2023-05-26 DIAGNOSIS — R5383 Other fatigue: Secondary | ICD-10-CM

## 2023-05-26 DIAGNOSIS — D508 Other iron deficiency anemias: Secondary | ICD-10-CM

## 2023-05-26 DIAGNOSIS — Z9884 Bariatric surgery status: Secondary | ICD-10-CM | POA: Diagnosis not present

## 2023-05-26 DIAGNOSIS — I10 Essential (primary) hypertension: Secondary | ICD-10-CM

## 2023-05-26 DIAGNOSIS — K912 Postsurgical malabsorption, not elsewhere classified: Secondary | ICD-10-CM

## 2023-05-26 DIAGNOSIS — Z8639 Personal history of other endocrine, nutritional and metabolic disease: Secondary | ICD-10-CM

## 2023-05-26 DIAGNOSIS — Z1321 Encounter for screening for nutritional disorder: Secondary | ICD-10-CM

## 2023-05-26 DIAGNOSIS — Z1231 Encounter for screening mammogram for malignant neoplasm of breast: Secondary | ICD-10-CM

## 2023-05-26 DIAGNOSIS — Z6835 Body mass index (BMI) 35.0-35.9, adult: Secondary | ICD-10-CM

## 2023-05-26 MED ORDER — ONDANSETRON HCL 8 MG PO TABS: 8.0000 mg | ORAL_TABLET | Freq: Three times a day (TID) | ORAL | 2 refills | Status: AC | PRN

## 2023-05-26 MED ORDER — LISINOPRIL-HYDROCHLOROTHIAZIDE 20-12.5 MG PO TABS: 1.0000 | ORAL_TABLET | Freq: Every day | ORAL | 1 refills | Status: DC

## 2023-05-26 MED ORDER — DICYCLOMINE HCL 20 MG PO TABS
20.0000 mg | ORAL_TABLET | Freq: Four times a day (QID) | ORAL | 2 refills | Status: AC | PRN
Start: 2023-05-26 — End: ?

## 2023-05-26 NOTE — Progress Notes (Signed)
Date:  05/26/2023   Name:  Shirley Cameron   DOB:  03/13/1971   MRN:  [id number]   Chief Complaint: Establish Care and Weight Gain (Gained 20 lbs and can't lose the weight , wants blood work done )  HPI Shirley Cameron is a pleasant 52 year old female new to this practice but previously known to me at another practice who presents today to establish care and follow-up on some chronic conditions:  Weight gain -patient is bothered by weight gain of about 20 pounds over the last 5 to 6 months.  States she is eating about the same or less than her baseline, but continues to gain weight.  Also reports fatigue which may or may not be related.  Would like thyroid checked. Postsurgical intestinal malabsorption - She has a history of obesity s/p gastric bypass which has unfortunately resulted in historical deficiencies in several vitamins and minerals, specifically iron, B12, B9, homocystine, vitamin D.  Further, she cannot tolerate oral iron so she required iron transfusion earlier this year with subsequent resolution of IDA.  Currently she reports taking a vitamin D and vitamin K supplement, might also take a B complex but not certain. IBS-D  -patient reports worsening IBS, feels her options for food variety in the diet are becoming more and more limited as time goes on.  Specifically she describes pronounced gastrocolic reflex and has to use the restroom within 5 to 10 minutes of any food intake.  Reports 6+ BM per day. She does not believe she has tried Bentyl.  She has a gastroenterologist, but she does not see them often.  Colonoscopy within the last year, though I do not have this report. Hypertension -endorses good compliance with current medications.  Has been on propranolol for some time, started because she had occasional headaches and also intermittent palpitations.  In addition to propranolol she also takes lisinopril-HCTZ. Historically struggles with labile BP.  Mammogram -patient desires mammogram,  last one was about 18 months ago and normal.  No breast complaints. She does have a history of abnormal mammogram (benign).   Medication list has been reviewed and updated.  Current Meds  Medication Sig   B-D 3CC LUER-LOK SYR 23GX1" 23G X 1" 3 ML MISC FOR USE AS DIRECTED WITH B12   clopidogrel (PLAVIX) 75 MG tablet Take 1 tablet (75 mg total) by mouth daily.   cyanocobalamin (VITAMIN B12) 1000 MCG/ML injection Inject into the muscle.   dicyclomine (BENTYL) 20 MG tablet Take 1 tablet (20 mg total) by mouth every 6 (six) hours as needed for spasms (IBS).   hydrOXYzine (ATARAX) 50 MG tablet Take 50 mg by mouth 3 (three) times daily as needed for itching or anxiety.   pravastatin (PRAVACHOL) 40 MG tablet Take 1 tablet (40 mg total) by mouth daily at 6 PM.   promethazine (PHENERGAN) 25 MG tablet Take 25 mg by mouth every 6 (six) hours as needed for nausea or vomiting.   temazepam (RESTORIL) 15 MG capsule Take 15-30 mg by mouth at bedtime.   [DISCONTINUED] lisinopril-hydrochlorothiazide (ZESTORETIC) 20-12.5 MG tablet Take 1 tablet by mouth daily.   [DISCONTINUED] ondansetron (ZOFRAN) 8 MG tablet Take 8 mg by mouth every 8 (eight) hours as needed for nausea or vomiting.   [DISCONTINUED] pantoprazole (PROTONIX) 40 MG tablet Take 40 mg by mouth daily as needed (acid reflux).   [DISCONTINUED] propranolol (INDERAL) 80 MG tablet Take 120 mg by mouth 2 (two) times daily. Taking 1 1/2 tablet BID tachycardia and headaches  Review of Systems  Constitutional:  Positive for fatigue and unexpected weight change. Negative for fever.  Respiratory:  Negative for chest tightness and shortness of breath.   Cardiovascular:  Negative for chest pain and palpitations.  Gastrointestinal:  Positive for diarrhea and nausea. Negative for abdominal pain and vomiting.    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   Anxiety 09/24/2016   Chronic headaches 09/24/2016   GERD (gastroesophageal reflux disease) 09/24/2016   PTSD (post-traumatic stress disorder) 09/24/2016   Hypertension 07/27/2016   Mild intermittent asthma without complication 07/27/2016   Patellofemoral dysfunction of right knee 03/16/2016   Primary osteoarthritis of right knee 02/06/2016   Iron deficiency anemia 11/04/2014   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    Allergies  Allergen Reactions   Ibuprofen Other (See Comments)    Gastric bypass, Gastric bypass   Nsaids     H/o gastric bypass   Penicillins Hives   Penicillin V Potassium Other (See Comments)    Childhood allergy - unknown reaction   Cephalexin Itching and Rash   Sulfamethoxazole-Trimethoprim Itching and Rash    Immunization History  Administered Date(s) Administered   Tdap 05/20/2016    Past Surgical History:  Procedure Laterality Date   ABDOMINAL HYSTERECTOMY     ARTHROSCOPIC REPAIR ACL     BREAST SURGERY     CHOLECYSTECTOMY     CHONDROPLASTY Right 10/18/2019   Procedure: CHONDROPLASTY;  Surgeon: Shirley Pippin, MD;  Location: [facility];  Service: Orthopedics;  Laterality: Right;   EXPLORATORY LAPAROTOMY     FRACTURE SURGERY     ankle   GASTRIC ROUX-EN-Y  12/19/2008   HARDWARE REMOVAL     KNEE ARTHROSCOPY WITH LATERAL MENISECTOMY Right 10/18/2019   Procedure: KNEE ARTHROSCOPY WITH LATERAL MENISECTOMY;  Surgeon: Shirley Pippin, MD;  Location: [facility];  Service: Orthopedics;  Laterality: Right;   OSTEOTOMY AND TIBIAL SHORTENING     TONSILLECTOMY     TOTAL KNEE ARTHROPLASTY Right 12/07/2021   Procedure: TOTAL KNEE ARTHROPLASTY;  Surgeon: Shirley Laura, MD;  Location: [facility];  Service: Orthopedics;  Laterality: Right;   WISDOM TOOTH EXTRACTION      Social History   Tobacco Use   Smoking status: Never   Smokeless tobacco: Never  Vaping Use   Vaping Use: Never used  Substance Use Topics   Alcohol use: Yes    Comment: rare   Drug use: Never    Family History  Problem Relation Age of Onset   Hypertension Mother    Hypertension Father    Bladder Cancer Father    Hypertension Sister    Hypertension Maternal Grandmother    Hypertension Maternal Grandfather    Stroke Paternal Grandmother    Hypertension Paternal Grandmother    Heart disease Paternal Grandmother    Hypertension Paternal Grandfather         05/26/2023    4:08 PM  GAD 7 : Generalized Anxiety Score  Nervous, Anxious, on Edge 1  Control/stop worrying 0  Worry too much - different things 1  Trouble relaxing 1  Restless 0  Easily annoyed or irritable 0  Afraid - awful  might happen 0  Total GAD 7 Score 3  Anxiety Difficulty Not difficult at all       05/26/2023    4:07 PM  Depression screen PHQ 2/9  Decreased Interest 0  Down, Depressed, Hopeless 0  PHQ - 2 Score 0  Altered sleeping 2  Tired, decreased energy 2  Change in appetite 1  Feeling bad or failure about yourself  0  Trouble concentrating 0  Moving slowly or fidgety/restless 0  Suicidal thoughts 0  PHQ-9 Score 5  Difficult doing work/chores Not difficult at all    BP Readings from Last 3 Encounters:  05/26/23 (!) 130/100  08/11/22 (!) 142/105  03/17/22 115/89    Wt Readings from Last 3 Encounters:  05/26/23 218 lb (98.9 kg)  08/11/22 202 lb (91.6 kg)  03/16/22 194 lb 7.1 oz (88.2 kg)    BP (!) 130/100   Pulse 75   Temp 98.4 F (36.9 C) (Oral)   Ht 5\' 6"  (1.676 m)   Wt 218 lb (98.9 kg)   SpO2 98%   BMI 35.19 kg/m   Physical Exam Vitals and nursing note reviewed.  Constitutional:      Appearance: Normal appearance.  Neck:     Vascular: No carotid bruit.   Cardiovascular:     Rate and Rhythm: Normal rate and regular rhythm.     Heart sounds: No murmur heard.    No friction rub. No gallop.  Pulmonary:     Effort: Pulmonary effort is normal.     Breath sounds: Normal breath sounds.     Recent Labs     Component Value Date/Time   NA 136 03/16/2022 0858   K 4.3 03/16/2022 0858   CL 102 03/16/2022 0858   CO2 23 03/16/2022 0857   GLUCOSE 134 (H) 03/16/2022 0858   BUN 19 03/16/2022 0858   CREATININE 0.90 03/16/2022 0858   CALCIUM 9.3 03/16/2022 0857   PROT 7.0 03/16/2022 0857   ALBUMIN 4.0 03/16/2022 0857   AST 53 (H) 03/16/2022 0857   ALT 60 (H) 03/16/2022 0857   ALKPHOS 92 03/16/2022 0857   BILITOT 0.8 03/16/2022 0857   GFRNONAA >60 03/16/2022 0857    Lab Results  Component Value Date   WBC 6.3 03/16/2022   HGB 11.6 (L) 03/16/2022   HCT 34.0 (L) 03/16/2022   MCV 86.4 03/16/2022   PLT 414 (H) 03/16/2022   Lab Results  Component Value Date   HGBA1C 5.1 03/17/2022   Lab Results  Component Value Date   CHOL 173 03/17/2022   HDL 68 03/17/2022   LDLCALC 87 03/17/2022   TRIG 89 03/17/2022   CHOLHDL 2.5 03/17/2022   Lab Results  Component Value Date   TSH 1.700 08/11/2022     Assessment and Plan: 1. Primary hypertension Curiously, BP recheck attempted by provider at multiple sites including R arm, L arm, and R wrist but could not be reliably auscultated. Refill lisinopril-HCTZ as below. Taper off propranolol with continued monitoring of home blood pressure as this is a potentially obesogenic medication. Call if blood pressure spikes as she tapers off this medication.  - lisinopril-hydrochlorothiazide (ZESTORETIC) 20-12.5 MG tablet; Take 1 tablet by mouth daily.  Dispense: 90 tablet; Refill: 1  2. Class 2 severe obesity with serious comorbidity and body mass index (BMI) of 35.0 to 35.9 in adult, unspecified obesity type (HCC) Check TSH to evaluate for possible hypothyroidism which could explain weight gain.  Also check  vitamin D due to history of  vitamin D deficiency as well as increased risk for deficiency with prior gastric bypass and obesity status - TSH - VITAMIN D 25 Hydroxy (Vit-D Deficiency, Fractures)  3. Chronic nausea Refill ondansetron for as needed use as below - ondansetron (ZOFRAN) 8 MG tablet; Take 1 tablet (8 mg total) by mouth every 8 (eight) hours as needed for nausea or vomiting.  Dispense: 20 tablet; Refill: 2  4. Irritable bowel syndrome with diarrhea Try Bentyl up to 4 times per day.  Information given on IBS, encourage patient to read up on low FODMAP diet - TSH - dicyclomine (BENTYL) 20 MG tablet; Take 1 tablet (20 mg total) by mouth every 6 (six) hours as needed for spasms (IBS).  Dispense: 120 tablet; Refill: 2  5. Postsurgical malabsorption History of malabsorption with multiple nutrient deficiencies.  Recheck today. - Iron, TIBC and Ferritin Panel - B12 and Folate Panel - VITAMIN D 25 Hydroxy (Vit-D Deficiency, Fractures)  6. Iron deficiency anemia secondary to inadequate dietary iron intake History of malabsorption with multiple nutrient deficiencies.  Recheck today. - CBC with Differential/Platelet - Iron, TIBC and Ferritin Panel  7. History of vitamin D deficiency History of malabsorption with multiple nutrient deficiencies.  Recheck today. - VITAMIN D 25 Hydroxy (Vit-D Deficiency, Fractures)  8. Encounter for vitamin deficiency screening History of malabsorption with multiple nutrient deficiencies.  Recheck today. - B12 and Folate Panel - VITAMIN D 25 Hydroxy (Vit-D Deficiency, Fractures)  9. Other fatigue History of malabsorption with multiple nutrient deficiencies.  Recheck today. - CBC with Differential/Platelet - Comprehensive metabolic panel - TSH - B12 and Folate Panel - VITAMIN D 25 Hydroxy (Vit-D Deficiency, Fractures)  10. Encounter for screening mammogram for breast cancer Mammogram ordered for patient, will try to align with next office visit in  about 1 month give or take.   Return in about 4 weeks (around 06/23/2023) for OV f/u chronic conditions.   Partially dictated using Animal nutritionist. Any errors are unintentional.  Alvester Morin, PA-C, DMSc, Nutritionist [facility] Primary Care and Sports Medicine [facility] [phone]

## 2023-05-26 NOTE — Patient Instructions (Addendum)
-  It was a pleasure to see you today! Please review your visit summary for helpful information -Lab results are usually available within 1-2 days and we will call once reviewed -I would encourage you to follow your care via MyChart where you can access lab results, notes, messages, and more -If you feel that we did a nice job today, please complete your after-visit survey and leave [place] a Google review! Your CMA today was Mariann Barter and your provider was Alvester Morin, PA-C, DMSc -Please return for follow-up in about 4 wks  -Remember the "Seven S's" of hypertension including salt, smoking, stimulants (e.g. caffeine), stress, sleep, snoring (OSA), sedentary lifestyle.

## 2023-05-27 ENCOUNTER — Telehealth: Payer: Self-pay

## 2023-05-27 DIAGNOSIS — K912 Postsurgical malabsorption, not elsewhere classified: Secondary | ICD-10-CM | POA: Insufficient documentation

## 2023-05-27 DIAGNOSIS — K580 Irritable bowel syndrome with diarrhea: Secondary | ICD-10-CM | POA: Insufficient documentation

## 2023-05-27 DIAGNOSIS — Z8639 Personal history of other endocrine, nutritional and metabolic disease: Secondary | ICD-10-CM | POA: Insufficient documentation

## 2023-05-27 LAB — CBC WITH DIFFERENTIAL/PLATELET
Basophils Absolute: 0 10*3/uL (ref 0.0–0.2)
Basos: 1 %
EOS (ABSOLUTE): 0.6 10*3/uL — ABNORMAL HIGH (ref 0.0–0.4)
Eos: 9 %
Hematocrit: 40.1 % (ref 34.0–46.6)
Hemoglobin: 13.1 g/dL (ref 11.1–15.9)
Immature Grans (Abs): 0 10*3/uL (ref 0.0–0.1)
Immature Granulocytes: 0 %
Lymphocytes Absolute: 1.7 10*3/uL (ref 0.7–3.1)
Lymphs: 26 %
MCH: 31.1 pg (ref 26.6–33.0)
MCHC: 32.7 g/dL (ref 31.5–35.7)
MCV: 95 fL (ref 79–97)
Monocytes Absolute: 0.4 10*3/uL (ref 0.1–0.9)
Monocytes: 7 %
Neutrophils Absolute: 3.7 10*3/uL (ref 1.4–7.0)
Neutrophils: 57 %
Platelets: 326 10*3/uL (ref 150–450)
RBC: 4.21 x10E6/uL (ref 3.77–5.28)
RDW: 11.9 % (ref 11.7–15.4)
WBC: 6.5 10*3/uL (ref 3.4–10.8)

## 2023-05-27 LAB — COMPREHENSIVE METABOLIC PANEL
ALT: 31 IU/L (ref 0–32)
AST: 29 IU/L (ref 0–40)
Albumin/Globulin Ratio: 1.6 (ref 1.2–2.2)
Albumin: 4.4 g/dL (ref 3.8–4.9)
Alkaline Phosphatase: 82 IU/L (ref 44–121)
BUN/Creatinine Ratio: 18 (ref 9–23)
BUN: 17 mg/dL (ref 6–24)
Bilirubin Total: 0.4 mg/dL (ref 0.0–1.2)
CO2: 19 mmol/L — ABNORMAL LOW (ref 20–29)
Calcium: 9.1 mg/dL (ref 8.7–10.2)
Chloride: 104 mmol/L (ref 96–106)
Creatinine, Ser: 0.95 mg/dL (ref 0.57–1.00)
Globulin, Total: 2.8 g/dL (ref 1.5–4.5)
Glucose: 89 mg/dL (ref 70–99)
Potassium: 4.2 mmol/L (ref 3.5–5.2)
Sodium: 138 mmol/L (ref 134–144)
Total Protein: 7.2 g/dL (ref 6.0–8.5)
eGFR: 73 mL/min/{1.73_m2} (ref >59)

## 2023-05-27 LAB — IRON,TIBC AND FERRITIN PANEL
Ferritin: 50 ng/mL (ref 15–150)
Iron Saturation: 24 % (ref 15–55)
Iron: 98 ug/dL (ref 27–159)
Total Iron Binding Capacity: 405 ug/dL (ref 250–450)
UIBC: 307 ug/dL (ref 131–425)

## 2023-05-27 LAB — B12 AND FOLATE PANEL
Folate: 14.7 ng/mL (ref >3.0)
Vitamin B-12: 367 pg/mL (ref 232–1245)

## 2023-05-27 LAB — TSH: TSH: 2.87 u[IU]/mL (ref 0.450–4.500)

## 2023-05-27 LAB — VITAMIN D 25 HYDROXY (VIT D DEFICIENCY, FRACTURES): Vit D, 25-Hydroxy: 31.5 ng/mL (ref 30.0–100.0)

## 2023-05-27 NOTE — Telephone Encounter (Signed)
-----   Message from Remo Lipps, [place] sent at 05/26/2023  4:47 PM EDT ----- Regarding: Next appt Patient had to rush out of here today to get labs done, so we did not make a f/u appt. Let's plan to make the f/u when we give her lab results, probably tomorrow (Friday). I'll try to put it in the result note too

## 2023-05-27 NOTE — Telephone Encounter (Signed)
Please schedule patient a follow up appointment.  KP

## 2023-05-27 NOTE — Telephone Encounter (Signed)
FYI  KP

## 2023-05-30 ENCOUNTER — Other Ambulatory Visit: Payer: Self-pay | Admitting: Physician Assistant

## 2023-05-30 DIAGNOSIS — F419 Anxiety disorder, unspecified: Secondary | ICD-10-CM

## 2023-05-30 MED ORDER — TEMAZEPAM 15 MG PO CAPS
15.0000 mg | ORAL_CAPSULE | Freq: Every day | ORAL | 2 refills | Status: DC
Start: 2023-06-04 — End: 2023-09-26

## 2023-05-31 ENCOUNTER — Inpatient Hospital Stay
Admission: RE | Admit: 2023-05-31 | Discharge: 2023-05-31 | Disposition: A | Discharge status: Home / Self Care | Payer: Self-pay | Source: Ambulatory Visit | Attending: Physician Assistant | Admitting: Physician Assistant

## 2023-05-31 ENCOUNTER — Other Ambulatory Visit: Payer: Self-pay | Admitting: *Deleted

## 2023-05-31 DIAGNOSIS — Z1231 Encounter for screening mammogram for malignant neoplasm of breast: Secondary | ICD-10-CM

## 2023-06-02 ENCOUNTER — Encounter: Payer: Self-pay | Admitting: Physician Assistant

## 2023-06-02 DIAGNOSIS — E538 Deficiency of other specified B group vitamins: Secondary | ICD-10-CM | POA: Insufficient documentation

## 2023-06-02 DIAGNOSIS — M359 Systemic involvement of connective tissue, unspecified: Secondary | ICD-10-CM | POA: Insufficient documentation

## 2023-06-27 ENCOUNTER — Ambulatory Visit
Admission: RE | Admit: 2023-06-27 | Discharge: 2023-06-27 | Disposition: A | Discharge status: Home / Self Care | Payer: BC Managed Care – PPO | Source: Ambulatory Visit | Attending: Physician Assistant | Admitting: Physician Assistant

## 2023-06-27 ENCOUNTER — Ambulatory Visit: Payer: BC Managed Care – PPO | Admitting: Physician Assistant

## 2023-06-27 ENCOUNTER — Encounter: Payer: Self-pay | Admitting: Physician Assistant

## 2023-06-27 VITALS — BP 124/90 | HR 76 | Temp 98.2°F | Ht 66.0 in | Wt 219.0 lb

## 2023-06-27 DIAGNOSIS — K580 Irritable bowel syndrome with diarrhea: Secondary | ICD-10-CM

## 2023-06-27 DIAGNOSIS — Z79899 Other long term (current) drug therapy: Secondary | ICD-10-CM

## 2023-06-27 DIAGNOSIS — I10 Essential (primary) hypertension: Secondary | ICD-10-CM | POA: Diagnosis not present

## 2023-06-27 DIAGNOSIS — F419 Anxiety disorder, unspecified: Secondary | ICD-10-CM

## 2023-06-27 DIAGNOSIS — Z1231 Encounter for screening mammogram for malignant neoplasm of breast: Secondary | ICD-10-CM | POA: Insufficient documentation

## 2023-06-27 DIAGNOSIS — Z6835 Body mass index (BMI) 35.0-35.9, adult: Secondary | ICD-10-CM

## 2023-06-27 MED ORDER — FLUOXETINE HCL 20 MG PO TABS
20.0000 mg | ORAL_TABLET | Freq: Every day | ORAL | 1 refills | Status: DC
Start: 2023-06-27 — End: 2023-08-12

## 2023-06-27 NOTE — Progress Notes (Signed)
Date:  06/27/2023   Name:  Shirley Cameron   DOB:  05-27-1971   MRN:  [id number]   Chief Complaint: Hypertension  HPI Omariana returns for 4wk f/u primarily to check HTN after d/c propranolol last visit. She states home readings have been 120-125/80-85. Pulse has also been well-controlled aside from a single incidence of "strong heartbeat" without chest pain which spontaneously resolved.   We discussed weight last time, still a concern.  She started "wall Pilates" which she feels is effective resistance training for her.  No weight loss appreciated in the last month  Anxiety high with recent deaths in the family and financial issues. Uses temazepam typically 1 tab nightly for insomnia which is likely anxiety related, rarely takes 2 tablets; due for UDS. She is stressed about her husband who has an infected tooth but will not go to dentist, also he has a hiatal hernia but refuses surgery to correct.   Bentyl prescribed last visit has helped with IBS-D, patient mainly uses it as needed on the weekends.  Medication list has been reviewed and updated.  Current Meds  Medication Sig   B-D 3CC LUER-LOK SYR 23GX1" 23G X 1" 3 ML MISC FOR USE AS DIRECTED WITH B12   clopidogrel (PLAVIX) 75 MG tablet Take 1 tablet (75 mg total) by mouth daily.   cyanocobalamin (VITAMIN B12) 1000 MCG/ML injection Inject into the muscle.   dicyclomine (BENTYL) 20 MG tablet Take 1 tablet (20 mg total) by mouth every 6 (six) hours as needed for spasms (IBS).   FLUoxetine (PROZAC) 20 MG tablet Take 1 tablet (20 mg total) by mouth daily.   hydrOXYzine (ATARAX) 50 MG tablet Take 50 mg by mouth 3 (three) times daily as needed for itching or anxiety.   lisinopril-hydrochlorothiazide (ZESTORETIC) 20-12.5 MG tablet Take 1 tablet by mouth daily.   ondansetron (ZOFRAN) 8 MG tablet Take 1 tablet (8 mg total) by mouth every 8 (eight) hours as needed for nausea or vomiting.   pravastatin (PRAVACHOL) 40 MG tablet Take 1 tablet  (40 mg total) by mouth daily at 6 PM.   promethazine (PHENERGAN) 25 MG tablet Take 25 mg by mouth every 6 (six) hours as needed for nausea or vomiting.   temazepam (RESTORIL) 15 MG capsule Take 1-2 capsules (15-30 mg total) by mouth at bedtime.     Review of Systems  Constitutional:  Negative for fatigue and fever.  Respiratory:  Negative for chest tightness and shortness of breath.   Cardiovascular:  Negative for chest pain and palpitations.  Gastrointestinal:  Negative for abdominal pain.  Psychiatric/Behavioral:  Positive for decreased concentration. The patient is nervous/anxious.     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   Irritable bowel syndrome with diarrhea 05/27/2023   History of vitamin D deficiency 05/27/2023   Class 2 severe obesity with serious comorbidity and body mass index (BMI) of 35.0 to 35.9 in adult [facility]) 05/27/2023   History of CVA (cerebrovascular accident) 03/16/2022   Abnormal mammogram of left breast 06/01/2018   Sinus tachycardia 10/12/2017   Chronic nausea 01/28/2017   Insomnia 11/30/2016   Bipolar disorder (HCC) 10/23/2016   Oppositional defiant disorder 10/23/2016   Attention-deficit hyperactivity disorder, 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   Anxiety 09/24/2016   Chronic headaches 09/24/2016   GERD (gastroesophageal reflux disease) 09/24/2016   PTSD (post-traumatic stress disorder) 09/24/2016  Hypertension 07/27/2016   Mild intermittent asthma without complication 07/27/2016   Patellofemoral dysfunction of right knee 03/16/2016   Primary osteoarthritis of right knee 02/06/2016   Iron deficiency anemia 11/04/2014   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    Allergies  Allergen Reactions   Ibuprofen Other (See Comments)    Gastric bypass, Gastric bypass   Nsaids     H/o gastric bypass    Penicillins Hives   Penicillin V Potassium Other (See Comments)    Childhood allergy - unknown reaction   Cephalexin Itching and Rash   Sulfamethoxazole-Trimethoprim Itching and Rash    Immunization History  Administered Date(s) Administered   Tdap 05/20/2016    Past Surgical History:  Procedure Laterality Date   ABDOMINAL HYSTERECTOMY     ARTHROSCOPIC REPAIR ACL     BREAST SURGERY     CHOLECYSTECTOMY     CHONDROPLASTY Right 10/18/2019   Procedure: CHONDROPLASTY;  Surgeon: Bjorn Pippin, MD;  Location: [facility];  Service: Orthopedics;  Laterality: Right;   EXPLORATORY LAPAROTOMY     FRACTURE SURGERY     ankle   GASTRIC ROUX-EN-Y  12/19/2008   HARDWARE REMOVAL     KNEE ARTHROSCOPY WITH LATERAL MENISECTOMY Right 10/18/2019   Procedure: KNEE ARTHROSCOPY WITH LATERAL MENISECTOMY;  Surgeon: Bjorn Pippin, MD;  Location: [facility];  Service: Orthopedics;  Laterality: Right;   OSTEOTOMY AND TIBIAL SHORTENING     TONSILLECTOMY     TOTAL KNEE ARTHROPLASTY Right 12/07/2021   Procedure: TOTAL KNEE ARTHROPLASTY;  Surgeon: Joen Laura, MD;  Location: [facility];  Service: Orthopedics;  Laterality: Right;   WISDOM TOOTH EXTRACTION      Social History   Tobacco Use   Smoking status: Never   Smokeless tobacco: Never  Vaping Use   Vaping Use: Never used  Substance Use Topics   Alcohol use: Yes    Comment: rare   Drug use: Never    Family History  Problem Relation Age of Onset   Hypertension Mother    Hypertension Father    Bladder Cancer Father    Hypertension Sister    Hypertension Maternal Grandmother    Hypertension Maternal Grandfather    Stroke Paternal Grandmother    Hypertension Paternal Grandmother    Heart disease Paternal Grandmother    Hypertension Paternal Grandfather         06/27/2023   11:16 AM 05/26/2023    4:08 PM  GAD 7 : Generalized Anxiety Score  Nervous, Anxious, on Edge 1 1  Control/stop worrying 1 0  Worry too  much - different things 1 1  Trouble relaxing 1 1  Restless 1 0  Easily annoyed or irritable 0 0  Afraid - awful might happen 0 0  Total GAD 7 Score 5 3  Anxiety Difficulty Not difficult at all Not difficult at all       06/27/2023   11:16 AM 05/26/2023    4:07 PM  Depression screen PHQ 2/9  Decreased Interest 1 0  Down, Depressed, Hopeless 0 0  PHQ - 2 Score 1 0  Altered sleeping 2 2  Tired, decreased energy 1 2  Change in appetite 1 1  Feeling bad or failure about yourself  0 0  Trouble concentrating 1 0  Moving slowly or fidgety/restless 0 0  Suicidal thoughts 0 0  PHQ-9 Score 6 5  Difficult doing work/chores Not difficult at all Not difficult at all    BP Readings from  Last 3 Encounters:  06/27/23 (!) 124/90  05/26/23 (!) 130/100  08/11/22 (!) 142/105    Wt Readings from Last 3 Encounters:  06/27/23 219 lb (99.3 kg)  05/26/23 218 lb (98.9 kg)  08/11/22 202 lb (91.6 kg)    BP (!) 124/90 (BP Location: Left Arm, Patient Position: Sitting, Cuff Size: Large)   Pulse 76   Temp 98.2 F (36.8 C) (Oral)   Ht 5\' 6"  (1.676 m)   Wt 219 lb (99.3 kg)   SpO2 98%   BMI 35.35 kg/m   Physical Exam Vitals and nursing note reviewed.  Constitutional:      Appearance: Normal appearance.  Cardiovascular:     Rate and Rhythm: Normal rate.  Pulmonary:     Effort: Pulmonary effort is normal.  Abdominal:     General: There is no distension.  Musculoskeletal:        General: Normal range of motion.  Skin:    General: Skin is warm and dry.  Neurological:     Mental Status: She is alert and oriented to person, place, and time.     Gait: Gait is intact.  Psychiatric:        Mood and Affect: Mood and affect normal.     Recent Labs     Component Value Date/Time   NA 138 05/26/2023 1653   K 4.2 05/26/2023 1653   CL 104 05/26/2023 1653   CO2 19 (L) 05/26/2023 1653   GLUCOSE 89 05/26/2023 1653   GLUCOSE 134 (H) 03/16/2022 0858   BUN 17 05/26/2023 1653   CREATININE 0.95  05/26/2023 1653   CALCIUM 9.1 05/26/2023 1653   PROT 7.2 05/26/2023 1653   ALBUMIN 4.4 05/26/2023 1653   AST 29 05/26/2023 1653   ALT 31 05/26/2023 1653   ALKPHOS 82 05/26/2023 1653   BILITOT 0.4 05/26/2023 1653   GFRNONAA >60 03/16/2022 0857    Lab Results  Component Value Date   WBC 6.5 05/26/2023   HGB 13.1 05/26/2023   HCT 40.1 05/26/2023   MCV 95 05/26/2023   PLT 326 05/26/2023   Lab Results  Component Value Date   HGBA1C 5.1 03/17/2022   Lab Results  Component Value Date   CHOL 173 03/17/2022   HDL 68 03/17/2022   LDLCALC 87 03/17/2022   TRIG 89 03/17/2022   CHOLHDL 2.5 03/17/2022   Lab Results  Component Value Date   TSH 2.870 05/26/2023     Assessment and Plan:  1. Primary hypertension Seems well-controlled after stopping propranolol, no change to medication regimen at this time, continue Zestoretic 20-12.5 mg once daily.  2. Anxiety Discussed the patient option for SSRI, which she would like to pursue.  Fluoxetine chosen due to relative weight neutrality, will trial for 2 to 4 weeks and report back at televisit in 1 month. - FLUoxetine (PROZAC) 20 MG tablet; Take 1 tablet (20 mg total) by mouth daily.  Dispense: 30 tablet; Refill: 1  3. Long-term current use of benzodiazepine Check UDS today.  Continue temazepam. - Drug Screen, Urine  4. Irritable bowel syndrome with diarrhea Improved with Bentyl, continue up to 4 times daily as needed.  Duloxetine may help with this some as well.  5. Class 2 severe obesity with serious comorbidity and body mass index (BMI) of 35.0 to 35.9 in adult, unspecified obesity type (HCC) Seems to be metabolically driven, as patient does not have a strong appetite and reports good portion control, therefore I do not believe caloric input to  be the primary problem.    Unfortunately Buford is not a candidate for stimulant medications due to her HTN, history of tachycardia, anxiety requiring BZD, and probable hx TIA vs CVA.    Technically she could use GLP-1 RA, but given her history of malabsorption s/p Roux-en-Y bypass I would rather not put her at increased risk for complication from malabsorption.  At this point I feel that her pharmacotherapy options for weight loss are limited due to her chronic conditions, so for now plan on continued lifestyle changes particularly when it comes to physical activity.   Return in about 4 weeks (around 07/25/2023) for Telehealth f/u anx/IBS/HTN.   Partially dictated using Animal nutritionist. Any errors are unintentional.  Alvester Morin, PA-C, DMSc, Nutritionist [facility] Primary Care and Sports Medicine [facility] [phone]

## 2023-06-28 LAB — DRUG SCREEN, URINE
Amphetamines, Urine: NEGATIVE ng/mL
Barbiturate screen, urine: NEGATIVE ng/mL
Benzodiazepine Quant, Ur: NEGATIVE ng/mL
Cannabinoid Quant, Ur: NEGATIVE ng/mL
Cocaine (Metab.): NEGATIVE ng/mL
Opiate Quant, Ur: NEGATIVE ng/mL
PCP Quant, Ur: NEGATIVE ng/mL

## 2023-07-07 ENCOUNTER — Ambulatory Visit: Payer: Self-pay | Admitting: Physician Assistant

## 2023-07-28 ENCOUNTER — Telehealth: Payer: BC Managed Care – PPO | Admitting: Physician Assistant

## 2023-07-29 ENCOUNTER — Telehealth: Payer: BC Managed Care – PPO | Admitting: Physician Assistant

## 2023-08-12 ENCOUNTER — Encounter: Payer: Self-pay | Admitting: Physician Assistant

## 2023-08-12 ENCOUNTER — Telehealth: Payer: BC Managed Care – PPO | Admitting: Physician Assistant

## 2023-08-12 VITALS — BP 130/84 | Ht 66.0 in

## 2023-08-12 DIAGNOSIS — I10 Essential (primary) hypertension: Secondary | ICD-10-CM

## 2023-08-12 DIAGNOSIS — F419 Anxiety disorder, unspecified: Secondary | ICD-10-CM | POA: Diagnosis not present

## 2023-08-12 DIAGNOSIS — K580 Irritable bowel syndrome with diarrhea: Secondary | ICD-10-CM

## 2023-08-12 MED ORDER — FLUOXETINE HCL 20 MG PO TABS
20.0000 mg | ORAL_TABLET | Freq: Every day | ORAL | 1 refills | Status: DC
Start: 2023-08-12 — End: 2023-09-26

## 2023-08-12 NOTE — Progress Notes (Signed)
Date:  08/12/2023   Name:  Shirley Cameron   DOB:  09/02/1971   MRN:  [id number]  I connected with Purcell Nails on 08/12/23 via MyChart Video and verified that I am speaking with the correct person using appropriate identifiers. The limitations, risks, security and privacy concerns of performing an evaluation and management service by MyChart Video, including the higher likelihood of inaccurate diagnoses and treatments, and the availability of in person appointments were reviewed. The possible need of an additional face-to-face encounter for complete and high quality delivery of care was discussed. The patient was also made aware that there may be a patient responsible charge related to this service. The patient expressed understanding and wishes to proceed.   Provider location is in medical facility [facility] at [facility]). Patient location is at their work place.  People involved in care of the patient during this telehealth encounter were myself, my CMA, and my front office/scheduling team member.  Chief Complaint: Anxiety, Irritable Bowel Syndrome, and Hypertension  HPI Shirley Cameron presents via telehealth today for 1 month follow-up on some of her chronic conditions including HTN, anxiety, IBS.  Last visit we started her on fluoxetine which was chosen for its weight neutrality, and patient feels this has given her increased "calming effect" at baseline, no significant side effects.  She reports a weight loss of roughly 3.5 pounds by her scale, happy about this.  The fluoxetine also seems to have had some benefit for her IBS, as she has been able to reduce her frequency of Bentyl use.  Overall she feels things are going well from a medical standpoint, though she still has some financial and emotional stressors in her life.  Blood pressures at home generally running about 130/80, states that [facility] accidentally dispensed regular lisinopril once instead of  lisinopril-HCTZ, though this has been corrected.   Medication list has been reviewed and updated.  Current Meds  Medication Sig   B-D 3CC LUER-LOK SYR 23GX1" 23G X 1" 3 ML MISC FOR USE AS DIRECTED WITH B12   clopidogrel (PLAVIX) 75 MG tablet Take 1 tablet (75 mg total) by mouth daily.   cyanocobalamin (VITAMIN B12) 1000 MCG/ML injection Inject into the muscle.   dicyclomine (BENTYL) 20 MG tablet Take 1 tablet (20 mg total) by mouth every 6 (six) hours as needed for spasms (IBS).   hydrOXYzine (ATARAX) 50 MG tablet Take 50 mg by mouth 3 (three) times daily as needed for itching or anxiety.   lisinopril-hydrochlorothiazide (ZESTORETIC) 20-12.5 MG tablet Take 1 tablet by mouth daily.   ondansetron (ZOFRAN) 8 MG tablet Take 1 tablet (8 mg total) by mouth every 8 (eight) hours as needed for nausea or vomiting.   pravastatin (PRAVACHOL) 40 MG tablet Take 1 tablet (40 mg total) by mouth daily at 6 PM.   promethazine (PHENERGAN) 25 MG tablet Take 25 mg by mouth every 6 (six) hours as needed for nausea or vomiting.   temazepam (RESTORIL) 15 MG capsule Take 1-2 capsules (15-30 mg total) by mouth at bedtime.   [DISCONTINUED] FLUoxetine (PROZAC) 20 MG tablet Take 1 tablet (20 mg total) by mouth daily.     Review of Systems  Constitutional:  Positive for fatigue. Negative for fever.  Respiratory:  Negative for chest tightness and shortness of breath.   Cardiovascular:  Negative for chest pain and palpitations.  Gastrointestinal:  Negative for abdominal pain.  Psychiatric/Behavioral:  Positive for decreased concentration. Negative for dysphoric mood.  The patient is nervous/anxious.     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   Irritable bowel syndrome with diarrhea 05/27/2023   History of vitamin D deficiency 05/27/2023   Class 2 severe  obesity with serious comorbidity and body mass index (BMI) of 35.0 to 35.9 in adult [facility]) 05/27/2023   History of CVA (cerebrovascular accident) 03/16/2022   Abnormal mammogram of left breast 06/01/2018   Sinus tachycardia 10/12/2017   Chronic nausea 01/28/2017   Insomnia 11/30/2016   Bipolar disorder (HCC) 10/23/2016   Attention-deficit hyperactivity disorder, 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   Anxiety 09/24/2016   Chronic headaches 09/24/2016   GERD (gastroesophageal reflux disease) 09/24/2016   PTSD (post-traumatic stress disorder) 09/24/2016   Hypertension 07/27/2016   Mild intermittent asthma without complication 07/27/2016   Patellofemoral dysfunction of right knee 03/16/2016   Primary osteoarthritis of right knee 02/06/2016   Iron deficiency anemia 11/04/2014   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    Allergies  Allergen Reactions   Ibuprofen Other (See Comments)    Gastric bypass, Gastric bypass   Nsaids     H/o gastric bypass   Penicillins Hives   Penicillin V Potassium Other (See Comments)    Childhood allergy - unknown reaction   Cephalexin Itching and Rash   Sulfamethoxazole-Trimethoprim Itching and Rash    Immunization History  Administered Date(s) Administered   Tdap 05/20/2016    Past Surgical History:  Procedure Laterality Date   ABDOMINAL HYSTERECTOMY     ARTHROSCOPIC REPAIR ACL     BREAST SURGERY     CHOLECYSTECTOMY     CHONDROPLASTY Right 10/18/2019   Procedure: CHONDROPLASTY;  Surgeon: Bjorn Pippin, MD;  Location: [facility];  Service: Orthopedics;  Laterality: Right;   EXPLORATORY LAPAROTOMY     FRACTURE SURGERY     ankle   GASTRIC ROUX-EN-Y  12/19/2008   HARDWARE REMOVAL     KNEE ARTHROSCOPY WITH LATERAL MENISECTOMY Right 10/18/2019   Procedure: KNEE ARTHROSCOPY WITH LATERAL MENISECTOMY;  Surgeon: Bjorn Pippin, MD;  Location: [facility];  Service: Orthopedics;  Laterality: Right;   OSTEOTOMY AND TIBIAL  SHORTENING     TONSILLECTOMY     TOTAL KNEE ARTHROPLASTY Right 12/07/2021   Procedure: TOTAL KNEE ARTHROPLASTY;  Surgeon: Joen Laura, MD;  Location: [facility];  Service: Orthopedics;  Laterality: Right;   WISDOM TOOTH EXTRACTION      Social History   Tobacco Use   Smoking status: Never   Smokeless tobacco: Never  Vaping Use   Vaping status: Never Used  Substance Use Topics   Alcohol use: Yes    Comment: rare   Drug use: Never    Family History  Problem Relation Age of Onset   Breast cancer Mother    Hypertension Mother    Hypertension Father    Bladder Cancer Father    Hypertension Sister    Breast cancer Maternal Grandmother    Hypertension Maternal Grandmother    Hypertension Maternal Grandfather    Stroke Paternal Grandmother    Hypertension Paternal Grandmother    Heart disease Paternal Grandmother    Hypertension Paternal Grandfather         08/12/2023    9:00 AM 06/27/2023   11:16 AM 05/26/2023    4:08 PM  GAD 7 : Generalized Anxiety Score  Nervous, Anxious, on Edge 2  1 1  Control/stop worrying 2 1 0  Worry too much - different things 2 1 1   Trouble relaxing 1 1 1   Restless 0 1 0  Easily annoyed or irritable 0 0 0  Afraid - awful might happen 0 0 0  Total GAD 7 Score 7 5 3   Anxiety Difficulty Not difficult at all Not difficult at all Not difficult at all       08/12/2023    9:00 AM 06/27/2023   11:16 AM 05/26/2023    4:07 PM  Depression screen PHQ 2/9  Decreased Interest 0 1 0  Down, Depressed, Hopeless 0 0 0  PHQ - 2 Score 0 1 0  Altered sleeping 2 2 2   Tired, decreased energy 1 1 2   Change in appetite 1 1 1   Feeling bad or failure about yourself  0 0 0  Trouble concentrating 0 1 0  Moving slowly or fidgety/restless 0 0 0  Suicidal thoughts 0 0 0  PHQ-9 Score 4 6 5   Difficult doing work/chores Not difficult at all Not difficult at all Not difficult at all    BP Readings from Last 3 Encounters:  08/12/23 130/84  06/27/23 (!) 124/90   05/26/23 (!) 130/100    Wt Readings from Last 3 Encounters:  06/27/23 219 lb (99.3 kg)  05/26/23 218 lb (98.9 kg)  08/11/22 202 lb (91.6 kg)    BP 130/84 Comment: at home ready with arm cuff  Ht 5\' 6"  (1.676 m)   BMI 35.35 kg/m   Physical Exam General: Speaking full sentences, no audible heavy breathing. Sounds alert and appropriately interactive. Well-appearing. Face symmetric. Extraocular movements intact. Pupils equal and round. No nasal flaring or accessory muscle use visualized.  Recent Labs     Component Value Date/Time   NA 138 05/26/2023 1653   K 4.2 05/26/2023 1653   CL 104 05/26/2023 1653   CO2 19 (L) 05/26/2023 1653   GLUCOSE 89 05/26/2023 1653   GLUCOSE 134 (H) 03/16/2022 0858   BUN 17 05/26/2023 1653   CREATININE 0.95 05/26/2023 1653   CALCIUM 9.1 05/26/2023 1653   PROT 7.2 05/26/2023 1653   ALBUMIN 4.4 05/26/2023 1653   AST 29 05/26/2023 1653   ALT 31 05/26/2023 1653   ALKPHOS 82 05/26/2023 1653   BILITOT 0.4 05/26/2023 1653   GFRNONAA >60 03/16/2022 0857    Lab Results  Component Value Date   WBC 6.5 05/26/2023   HGB 13.1 05/26/2023   HCT 40.1 05/26/2023   MCV 95 05/26/2023   PLT 326 05/26/2023   Lab Results  Component Value Date   HGBA1C 5.1 03/17/2022   Lab Results  Component Value Date   CHOL 173 03/17/2022   HDL 68 03/17/2022   LDLCALC 87 03/17/2022   TRIG 89 03/17/2022   CHOLHDL 2.5 03/17/2022   Lab Results  Component Value Date   TSH 2.870 05/26/2023     Assessment and Plan:  1. Primary hypertension Seems to be well-controlled by patient report, continue current medications.  2. Anxiety Improving with fluoxetine, though not completely resolved.  Option to increase dose, but patient would like to keep this dose for now and see how things go over the next few weeks which I feel is perfectly reasonable.  Refilling at increased quantity of 90 tabs per dispense.  May continue temazepam as prescribed.  Will need to return  in-person no later than January 2025 for UDS. - FLUoxetine (PROZAC) 20 MG tablet; Take 1 tablet (20  mg total) by mouth daily.  Dispense: 90 tablet; Refill: 1  3. Irritable bowel syndrome with diarrhea Also seems to have improved with fluoxetine.  Continue this medication for now, with Bentyl as needed   Return if symptoms worsen or fail to improve.   I discussed the above assessment and treatment plan with the patient. The patient was provided an opportunity to ask questions and all were answered. The patient agreed with the plan and demonstrated an understanding of the instructions. The patient was advised to call back or seek an in-person evaluation if the symptoms worsen or if the condition fails to improve as anticipated. I provided a total time of 21 minutes inclusive of time utilized for medical chart review, information gathering, care coordination with staff, and documentation completion. Alvester Morin, PA-C, DMSc, Nutritionist [facility] [phone]

## 2023-08-12 NOTE — Patient Instructions (Signed)
-  It was a pleasure to see you today! Please review your visit summary for helpful information -If you feel that we did a nice job today, please complete your after-visit survey and leave [place] a Google review! Your CMA today was Mariann Barter and your provider was Alvester Morin, PA-C, DMSc -Please return for follow-up in about 5 months, sooner if needed

## 2023-08-29 ENCOUNTER — Other Ambulatory Visit: Payer: Self-pay | Admitting: Physician Assistant

## 2023-08-29 DIAGNOSIS — R000 Tachycardia, unspecified: Secondary | ICD-10-CM

## 2023-08-29 MED ORDER — PROPRANOLOL HCL 40 MG PO TABS
40.0000 mg | ORAL_TABLET | Freq: Two times a day (BID) | ORAL | 1 refills | Status: DC | PRN
Start: 2023-08-29 — End: 2024-02-20

## 2023-08-29 NOTE — Telephone Encounter (Signed)
Please advise 

## 2023-09-26 ENCOUNTER — Encounter: Payer: Self-pay | Admitting: Physician Assistant

## 2023-09-26 ENCOUNTER — Telehealth (INDEPENDENT_AMBULATORY_CARE_PROVIDER_SITE_OTHER): Payer: BC Managed Care – PPO | Admitting: Physician Assistant

## 2023-09-26 VITALS — Ht 66.0 in | Wt 219.0 lb

## 2023-09-26 DIAGNOSIS — Z8673 Personal history of transient ischemic attack (TIA), and cerebral infarction without residual deficits: Secondary | ICD-10-CM

## 2023-09-26 DIAGNOSIS — F419 Anxiety disorder, unspecified: Secondary | ICD-10-CM

## 2023-09-26 MED ORDER — VENLAFAXINE HCL ER 75 MG PO CP24
75.0000 mg | ORAL_CAPSULE | Freq: Every day | ORAL | 1 refills | Status: DC
Start: 2023-09-26 — End: 2023-11-22

## 2023-09-26 MED ORDER — HYDROXYZINE HCL 50 MG PO TABS: 50.0000 mg | ORAL_TABLET | Freq: Three times a day (TID) | ORAL | 1 refills | Status: DC | PRN

## 2023-09-26 MED ORDER — TEMAZEPAM 15 MG PO CAPS
15.0000 mg | ORAL_CAPSULE | Freq: Two times a day (BID) | ORAL | 2 refills | Status: DC | PRN
Start: 2023-09-26 — End: 2024-01-03

## 2023-09-26 MED ORDER — CLOPIDOGREL BISULFATE 75 MG PO TABS
75.0000 mg | ORAL_TABLET | Freq: Every day | ORAL | 1 refills | Status: DC
Start: 2023-09-26 — End: 2024-02-20

## 2023-09-26 NOTE — Progress Notes (Signed)
Date:  09/26/2023   Name:  Shirley Cameron   DOB:  1971-08-07   MRN:  [id number]   I connected with Purcell Nails on 09/26/23 via MyChart Video and verified that I am speaking with the correct person using appropriate identifiers. The limitations, risks, security and privacy concerns of performing an evaluation and management service by MyChart Video, including the higher likelihood of inaccurate diagnoses and treatments, and the availability of in person appointments were reviewed. The possible need of an additional face-to-face encounter for complete and high quality delivery of care was discussed. The patient was also made aware that there may be a patient responsible charge related to this service. The patient expressed understanding and wishes to proceed.   Provider location is in medical facility [facility] at [facility]). Patient location is at their home People involved in care of the patient during this telehealth encounter were myself, my CMA, and my front office/scheduling team member.    Chief Complaint: Anxiety  Anxiety Symptoms include decreased concentration, nausea and nervous/anxious behavior. Patient reports no chest pain, palpitations, shortness of breath or suicidal ideas.     Shirley Cameron presents via telehealth today for evaluation of acutely worsened anxiety, likely due to issues in the workplace, says she had to submit an ethics complaint.  She reports increase in panic attacks and significant GI distress which she attributes directly to the anxiety.  Specifically she complains of nausea, vomiting, diarrhea.  During a panic attack she experiences tachycardia, tachypnea, chest tightness, and nervousness.  Typically she uses temazepam only at night for insomnia, and has not taken it during the day.  Still using hydroxyzine as needed, but feels it is not strong enough for the current situation. Unfortunately she stopped fluoxetine  about 10 days ago he felt like it was not working despite a good report last visit.  Thankfully, her employer will pay for 8 sessions of therapy and her first session is this afternoon.   Medication list has been reviewed and updated.  Current Meds  Medication Sig   B-D 3CC LUER-LOK SYR 23GX1" 23G X 1" 3 ML MISC FOR USE AS DIRECTED WITH B12   cyanocobalamin (VITAMIN B12) 1000 MCG/ML injection Inject into the muscle.   dicyclomine (BENTYL) 20 MG tablet Take 1 tablet (20 mg total) by mouth every 6 (six) hours as needed for spasms (IBS).   lisinopril-hydrochlorothiazide (ZESTORETIC) 20-12.5 MG tablet Take 1 tablet by mouth daily.   ondansetron (ZOFRAN) 8 MG tablet Take 1 tablet (8 mg total) by mouth every 8 (eight) hours as needed for nausea or vomiting.   pravastatin (PRAVACHOL) 40 MG tablet Take 1 tablet (40 mg total) by mouth daily at 6 PM.   promethazine (PHENERGAN) 25 MG tablet Take 25 mg by mouth every 6 (six) hours as needed for nausea or vomiting.   propranolol (INDERAL) 40 MG tablet Take 1 tablet (40 mg total) by mouth 2 (two) times daily as needed (for tachycardia).   venlafaxine XR (EFFEXOR XR) 75 MG 24 hr capsule Take 1 capsule (75 mg total) by mouth daily with breakfast. For anxiety   [DISCONTINUED] clopidogrel (PLAVIX) 75 MG tablet Take 1 tablet (75 mg total) by mouth daily.   [DISCONTINUED] hydrOXYzine (ATARAX) 50 MG tablet Take 50 mg by mouth 3 (three) times daily as needed for itching or anxiety.   [DISCONTINUED] temazepam (RESTORIL) 15 MG capsule Take 1-2 capsules (15-30 mg total) by mouth at bedtime.  Review of Systems  Constitutional:  Negative for fatigue and fever.  Respiratory:  Positive for chest tightness. Negative for shortness of breath.   Cardiovascular:  Negative for chest pain and palpitations.  Gastrointestinal:  Positive for diarrhea, nausea and vomiting. Negative for abdominal pain.  Psychiatric/Behavioral:  Positive for decreased concentration and sleep  disturbance. Negative for self-injury and suicidal ideas. The patient is nervous/anxious.     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   Irritable bowel syndrome with diarrhea 05/27/2023   History of vitamin D deficiency 05/27/2023   Class 2 severe obesity with serious comorbidity and body mass index (BMI) of 35.0 to 35.9 in adult [facility]) 05/27/2023   History of CVA (cerebrovascular accident) 03/16/2022   Abnormal mammogram of left breast 06/01/2018   Sinus tachycardia 10/12/2017   Chronic nausea 01/28/2017   Insomnia 11/30/2016   Bipolar disorder (HCC) 10/23/2016   Attention-deficit hyperactivity disorder, 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   Anxiety 09/24/2016   Chronic headaches 09/24/2016   GERD (gastroesophageal reflux disease) 09/24/2016   PTSD (post-traumatic stress disorder) 09/24/2016   Hypertension 07/27/2016   Mild intermittent asthma without complication 07/27/2016   Patellofemoral dysfunction of right knee 03/16/2016   Primary osteoarthritis of right knee 02/06/2016   Iron deficiency anemia 11/04/2014   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    Allergies  Allergen Reactions   Ibuprofen Other (See Comments)    Gastric bypass, Gastric bypass   Nsaids     H/o gastric bypass   Penicillins Hives   Penicillin V Potassium Other (See Comments)    Childhood allergy - unknown reaction   Cephalexin Itching and Rash   Sulfamethoxazole-Trimethoprim Itching and Rash    Immunization History  Administered Date(s) Administered   Tdap 05/20/2016    Past Surgical History:  Procedure Laterality Date   ABDOMINAL HYSTERECTOMY     ARTHROSCOPIC REPAIR ACL     BREAST SURGERY     CHOLECYSTECTOMY     CHONDROPLASTY Right 10/18/2019   Procedure: CHONDROPLASTY;  Surgeon: Bjorn Pippin, MD;  Location: [facility];   Service: Orthopedics;  Laterality: Right;   EXPLORATORY LAPAROTOMY     FRACTURE SURGERY     ankle   GASTRIC ROUX-EN-Y  12/19/2008   HARDWARE REMOVAL     KNEE ARTHROSCOPY WITH LATERAL MENISECTOMY Right 10/18/2019   Procedure: KNEE ARTHROSCOPY WITH LATERAL MENISECTOMY;  Surgeon: Bjorn Pippin, MD;  Location: [facility];  Service: Orthopedics;  Laterality: Right;   OSTEOTOMY AND TIBIAL SHORTENING     TONSILLECTOMY     TOTAL KNEE ARTHROPLASTY Right 12/07/2021   Procedure: TOTAL KNEE ARTHROPLASTY;  Surgeon: Joen Laura, MD;  Location: [facility];  Service: Orthopedics;  Laterality: Right;   WISDOM TOOTH EXTRACTION      Social History   Tobacco Use   Smoking status: Never   Smokeless tobacco: Never  Vaping Use   Vaping status: Never Used  Substance Use Topics   Alcohol use: Yes    Comment: rare   Drug use: Never    Family History  Problem Relation Age of Onset   Breast cancer Mother    Hypertension Mother    Hypertension Father    Bladder Cancer Father    Hypertension Sister    Breast cancer Maternal Grandmother    Hypertension Maternal Grandmother    Hypertension Maternal Grandfather  Stroke Paternal Grandmother    Hypertension Paternal Grandmother    Heart disease Paternal Grandmother    Hypertension Paternal Grandfather         09/26/2023    8:12 AM 08/12/2023    9:00 AM 06/27/2023   11:16 AM 05/26/2023    4:08 PM  GAD 7 : Generalized Anxiety Score  Nervous, Anxious, on Edge 3 2 1 1   Control/stop worrying 3 2 1  0  Worry too much - different things 3 2 1 1   Trouble relaxing 3 1 1 1   Restless 3 0 1 0  Easily annoyed or irritable 0 0 0 0  Afraid - awful might happen 1 0 0 0  Total GAD 7 Score 16 7 5 3   Anxiety Difficulty Somewhat difficult Not difficult at all Not difficult at all Not difficult at all       09/26/2023    8:11 AM 08/12/2023    9:00 AM 06/27/2023   11:16 AM  Depression screen PHQ 2/9  Decreased Interest 0 0 1  Down, Depressed,  Hopeless 0 0 0  PHQ - 2 Score 0 0 1  Altered sleeping 3 2 2   Tired, decreased energy 3 1 1   Change in appetite 3 1 1   Feeling bad or failure about yourself  0 0 0  Trouble concentrating 2 0 1  Moving slowly or fidgety/restless 2 0 0  Suicidal thoughts 0 0 0  PHQ-9 Score 13 4 6   Difficult doing work/chores Somewhat difficult Not difficult at all Not difficult at all    BP Readings from Last 3 Encounters:  08/12/23 130/84  06/27/23 (!) 124/90  05/26/23 (!) 130/100    Wt Readings from Last 3 Encounters:  09/26/23 219 lb (99.3 kg)  06/27/23 219 lb (99.3 kg)  05/26/23 218 lb (98.9 kg)    Ht 5\' 6"  (1.676 m)   Wt 219 lb (99.3 kg)   BMI 35.35 kg/m   Physical Exam General: Speaking full sentences, no audible heavy breathing. Sounds alert and appropriately interactive. Well-appearing. Face symmetric. Extraocular movements intact. Pupils equal and round. No nasal flaring or accessory muscle use visualized.  Recent Labs     Component Value Date/Time   NA 138 05/26/2023 1653   K 4.2 05/26/2023 1653   CL 104 05/26/2023 1653   CO2 19 (L) 05/26/2023 1653   GLUCOSE 89 05/26/2023 1653   GLUCOSE 134 (H) 03/16/2022 0858   BUN 17 05/26/2023 1653   CREATININE 0.95 05/26/2023 1653   CALCIUM 9.1 05/26/2023 1653   PROT 7.2 05/26/2023 1653   ALBUMIN 4.4 05/26/2023 1653   AST 29 05/26/2023 1653   ALT 31 05/26/2023 1653   ALKPHOS 82 05/26/2023 1653   BILITOT 0.4 05/26/2023 1653   GFRNONAA >60 03/16/2022 0857    Lab Results  Component Value Date   WBC 6.5 05/26/2023   HGB 13.1 05/26/2023   HCT 40.1 05/26/2023   MCV 95 05/26/2023   PLT 326 05/26/2023   Lab Results  Component Value Date   HGBA1C 5.1 03/17/2022   Lab Results  Component Value Date   CHOL 173 03/17/2022   HDL 68 03/17/2022   LDLCALC 87 03/17/2022   TRIG 89 03/17/2022   CHOLHDL 2.5 03/17/2022   Lab Results  Component Value Date   TSH 2.870 05/26/2023     Assessment and Plan:  1. Anxiety Stop fluoxetine  and try venlafaxine once daily.  Explained will take likely 1 to 2 weeks to have benefit, so  in the meantime she may continue with hydroxyzine as needed.  I am also increasing the quantity of dispense for temazepam for this month, as she might consider taking a 15 mg tablet during the daytime on an as-needed basis for acute anxiety/panic. Patient aware this may cause sedation especially if combined with hydroxyzine, and she should be mindful to assess any side effects before she attempts to drive or operate machinery.  We discussed the importance of routine physical activity in reducing stress and anxiety.  May find benefit from yoga, mindfulness, and breathing techniques including box breathing as nonpharmacologic options for reducing anxiety.  Strongly encouraged to continue with therapy as planned which I think would be very helpful for her.   - hydrOXYzine (ATARAX) 50 MG tablet; Take 1 tablet (50 mg total) by mouth 3 (three) times daily as needed for itching or anxiety.  Dispense: 90 tablet; Refill: 1 - temazepam (RESTORIL) 15 MG capsule; Take 1-2 capsules (15-30 mg total) by mouth 2 (two) times daily as needed for sleep (Max 45 mg per day). May use one capsule (15 mg) for panic symptoms during the day but may cause sedation.  Dispense: 90 capsule; Refill: 2 - venlafaxine XR (EFFEXOR XR) 75 MG 24 hr capsule; Take 1 capsule (75 mg total) by mouth daily with breakfast. For anxiety  Dispense: 30 capsule; Refill: 1  2. History of TIA (transient ischemic attack) Refill clopidogrel as requested - clopidogrel (PLAVIX) 75 MG tablet; Take 1 tablet (75 mg total) by mouth daily.  Dispense: 90 tablet; Refill: 1    Follow-up PRN. Will need in-person visit no later than Jan 2025  I discussed the above assessment and treatment plan with the patient. The patient was provided an opportunity to ask questions and all were answered. The patient agreed with the plan and demonstrated an understanding of the  instructions. The patient was advised to call back or seek an in-person evaluation if the symptoms worsen or if the condition fails to improve as anticipated. I provided a total time of 25 minutes inclusive of time utilized for medical chart review, information gathering, care coordination with staff, and documentation completion.  Alvester Morin, PA-C, DMSc, Nutritionist [facility] [phone]

## 2023-11-22 ENCOUNTER — Other Ambulatory Visit: Payer: Self-pay | Admitting: Physician Assistant

## 2023-11-22 DIAGNOSIS — F419 Anxiety disorder, unspecified: Secondary | ICD-10-CM

## 2023-11-23 ENCOUNTER — Other Ambulatory Visit: Payer: Self-pay | Admitting: Physician Assistant

## 2023-11-23 DIAGNOSIS — I10 Essential (primary) hypertension: Secondary | ICD-10-CM

## 2023-11-29 ENCOUNTER — Other Ambulatory Visit: Payer: Self-pay

## 2023-11-29 ENCOUNTER — Other Ambulatory Visit (HOSPITAL_COMMUNITY): Payer: Self-pay

## 2023-11-29 ENCOUNTER — Other Ambulatory Visit: Payer: Self-pay | Admitting: Physician Assistant

## 2023-11-29 DIAGNOSIS — F419 Anxiety disorder, unspecified: Secondary | ICD-10-CM

## 2023-11-29 MED ORDER — PROMETHAZINE HCL 25 MG PO TABS
25.0000 mg | ORAL_TABLET | Freq: Four times a day (QID) | ORAL | 2 refills | Status: DC | PRN
  Filled 2023-11-29: qty 90, 23d supply, fill #0

## 2023-11-29 MED ORDER — PROMETHAZINE HCL 25 MG PO TABS: 25.0000 mg | ORAL_TABLET | Freq: Four times a day (QID) | ORAL | 2 refills | Status: AC | PRN

## 2023-11-29 NOTE — Telephone Encounter (Signed)
Please review.  KP

## 2023-12-05 ENCOUNTER — Other Ambulatory Visit (HOSPITAL_COMMUNITY): Payer: Self-pay

## 2024-01-03 ENCOUNTER — Other Ambulatory Visit: Payer: Self-pay | Admitting: Physician Assistant

## 2024-01-03 DIAGNOSIS — F419 Anxiety disorder, unspecified: Secondary | ICD-10-CM

## 2024-01-03 NOTE — Telephone Encounter (Signed)
 Please review.  KP

## 2024-02-10 ENCOUNTER — Ambulatory Visit: Payer: BC Managed Care – PPO | Admitting: Physician Assistant

## 2024-02-17 ENCOUNTER — Ambulatory Visit: Payer: BC Managed Care – PPO | Admitting: Physician Assistant

## 2024-02-20 ENCOUNTER — Ambulatory Visit: Payer: BC Managed Care – PPO | Admitting: Physician Assistant

## 2024-02-20 ENCOUNTER — Encounter: Payer: Self-pay | Admitting: Physician Assistant

## 2024-02-20 VITALS — BP 114/84 | HR 70 | Temp 98.7°F | Ht 66.0 in | Wt 225.0 lb

## 2024-02-20 DIAGNOSIS — R000 Tachycardia, unspecified: Secondary | ICD-10-CM

## 2024-02-20 DIAGNOSIS — R232 Flushing: Secondary | ICD-10-CM

## 2024-02-20 DIAGNOSIS — F419 Anxiety disorder, unspecified: Secondary | ICD-10-CM | POA: Diagnosis not present

## 2024-02-20 DIAGNOSIS — I10 Essential (primary) hypertension: Secondary | ICD-10-CM

## 2024-02-20 DIAGNOSIS — Z79899 Other long term (current) drug therapy: Secondary | ICD-10-CM | POA: Diagnosis not present

## 2024-02-20 DIAGNOSIS — Z8673 Personal history of transient ischemic attack (TIA), and cerebral infarction without residual deficits: Secondary | ICD-10-CM | POA: Diagnosis not present

## 2024-02-20 MED ORDER — PROPRANOLOL HCL 40 MG PO TABS
40.0000 mg | ORAL_TABLET | Freq: Two times a day (BID) | ORAL | 1 refills | Status: DC | PRN
Start: 2024-02-20 — End: 2024-08-17

## 2024-02-20 MED ORDER — LISINOPRIL-HYDROCHLOROTHIAZIDE 20-12.5 MG PO TABS
1.0000 | ORAL_TABLET | Freq: Every day | ORAL | 1 refills | Status: DC
Start: 2024-02-20 — End: 2024-08-21

## 2024-02-20 MED ORDER — CLOPIDOGREL BISULFATE 75 MG PO TABS: 75.0000 mg | ORAL_TABLET | Freq: Every day | ORAL | 1 refills | Status: DC

## 2024-02-20 MED ORDER — TEMAZEPAM 15 MG PO CAPS
15.0000 mg | ORAL_CAPSULE | Freq: Every evening | ORAL | 1 refills | Status: DC | PRN
Start: 2024-02-20 — End: 2024-04-16

## 2024-02-20 NOTE — Progress Notes (Signed)
 Date:  02/20/2024   Name:  Shirley Cameron   DOB:  January 02, 1971   MRN:  [id number]   Chief Complaint: Medical Management of Chronic Issues  HPI Shirley Cameron presents today for 44-month follow-up, somewhat overdue for chronic conditions including sinus tachycardia, HTN, anxiety.  She is requesting refills on several medications.  Also due for UDS today.  In addition she states that she has been having hot flashes, history of hysterectomy but still has ovaries.  Desires to check hormone labs. Also reports recent weight gain, 6 lb over 5 months by our scale.  History of TIA on chronic antiplatelet therapy due for lipids.  It has been a tough past few months for her, son recently relapsed and is now in prison for the next 3 years.   Medication list has been reviewed and updated.  Current Meds  Medication Sig   dicyclomine (BENTYL) 20 MG tablet Take 1 tablet (20 mg total) by mouth every 6 (six) hours as needed for spasms (IBS).   hydrOXYzine (ATARAX) 50 MG tablet TAKE 1 TABLET (50 MG TOTAL) BY MOUTH 3 (THREE) TIMES DAILY AS NEEDED FOR ITCHING OR ANXIETY.   ondansetron (ZOFRAN) 8 MG tablet Take 1 tablet (8 mg total) by mouth every 8 (eight) hours as needed for nausea or vomiting.   pravastatin (PRAVACHOL) 40 MG tablet Take 1 tablet (40 mg total) by mouth daily at 6 PM.   promethazine (PHENERGAN) 25 MG tablet Take 1 tablet (25 mg total) by mouth every 6 (six) hours as needed for nausea or vomiting.   [DISCONTINUED] clopidogrel (PLAVIX) 75 MG tablet Take 1 tablet (75 mg total) by mouth daily.   [DISCONTINUED] lisinopril-hydrochlorothiazide (ZESTORETIC) 20-12.5 MG tablet TAKE 1 TABLET BY MOUTH EVERY DAY   [DISCONTINUED] propranolol (INDERAL) 40 MG tablet Take 1 tablet (40 mg total) by mouth 2 (two) times daily as needed (for tachycardia).   [DISCONTINUED] temazepam (RESTORIL) 15 MG capsule TAKE 1-2 CAPSULES BY MOUTH 2 (TWO) TIMES DAILY AS NEEDED FOR SLEEP (MAX 45 MG PER DAY). MAY USE ONE CAPSULE (15  MG) FOR PANIC SYMPTOMS DURING THE DAY BUT MAY CAUSE SEDATION.     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   Irritable bowel syndrome with diarrhea 05/27/2023   History of vitamin D deficiency 05/27/2023   Class 2 severe obesity with serious comorbidity and body mass index (BMI) of 35.0 to 35.9 in adult [facility]) 05/27/2023   History of CVA (cerebrovascular accident) 03/16/2022   Abnormal mammogram of left breast 06/01/2018   Sinus tachycardia 10/12/2017   Chronic nausea 01/28/2017   Insomnia 11/30/2016   Bipolar disorder (HCC) 10/23/2016   Attention-deficit hyperactivity disorder, 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   Anxiety 09/24/2016   Chronic headaches 09/24/2016   GERD (gastroesophageal reflux disease) 09/24/2016   PTSD (post-traumatic stress disorder) 09/24/2016   Hypertension 07/27/2016   Mild intermittent asthma without complication 07/27/2016   Patellofemoral dysfunction of right knee 03/16/2016   Primary osteoarthritis of right knee 02/06/2016   Iron deficiency anemia 11/04/2014   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    Allergies  Allergen Reactions   Ibuprofen Other (See Comments)    Gastric bypass, Gastric bypass   Nsaids     H/o gastric bypass   Penicillins Hives   Penicillin V Potassium Other (See Comments)    Childhood allergy -  unknown reaction   Cephalexin Itching and Rash   Sulfamethoxazole-Trimethoprim Itching and Rash    Immunization History  Administered Date(s) Administered   Tdap 05/20/2016    Past Surgical History:  Procedure Laterality Date   ABDOMINAL HYSTERECTOMY     ARTHROSCOPIC REPAIR ACL     BREAST SURGERY     CHOLECYSTECTOMY     CHONDROPLASTY Right 10/18/2019   Procedure: CHONDROPLASTY;  Surgeon: Bjorn Pippin, MD;  Location: [facility];   Service: Orthopedics;  Laterality: Right;   EXPLORATORY LAPAROTOMY     FRACTURE SURGERY     ankle   GASTRIC ROUX-EN-Y  12/19/2008   HARDWARE REMOVAL     KNEE ARTHROSCOPY WITH LATERAL MENISECTOMY Right 10/18/2019   Procedure: KNEE ARTHROSCOPY WITH LATERAL MENISECTOMY;  Surgeon: Bjorn Pippin, MD;  Location: [facility];  Service: Orthopedics;  Laterality: Right;   OSTEOTOMY AND TIBIAL SHORTENING     TONSILLECTOMY     TOTAL KNEE ARTHROPLASTY Right 12/07/2021   Procedure: TOTAL KNEE ARTHROPLASTY;  Surgeon: Joen Laura, MD;  Location: [facility];  Service: Orthopedics;  Laterality: Right;   WISDOM TOOTH EXTRACTION      Social History   Tobacco Use   Smoking status: Never   Smokeless tobacco: Never  Vaping Use   Vaping status: Never Used  Substance Use Topics   Alcohol use: Yes    Comment: rare   Drug use: Never    Family History  Problem Relation Age of Onset   Breast cancer Mother    Hypertension Mother    Hypertension Father    Bladder Cancer Father    Hypertension Sister    Breast cancer Maternal Grandmother    Hypertension Maternal Grandmother    Hypertension Maternal Grandfather    Stroke Paternal Grandmother    Hypertension Paternal Grandmother    Heart disease Paternal Grandmother    Hypertension Paternal Grandfather         02/20/2024    3:07 PM 09/26/2023    8:12 AM 08/12/2023    9:00 AM 06/27/2023   11:16 AM  GAD 7 : Generalized Anxiety Score  Nervous, Anxious, on Edge 2 3 2 1   Control/stop worrying 2 3 2 1   Worry too much - different things 2 3 2 1   Trouble relaxing 2 3 1 1   Restless 0 3 0 1  Easily annoyed or irritable 0 0 0 0  Afraid - awful might happen 1 1 0 0  Total GAD 7 Score 9 16 7 5   Anxiety Difficulty Not difficult at all Somewhat difficult Not difficult at all Not difficult at all       02/20/2024    3:07 PM 09/26/2023    8:11 AM 08/12/2023    9:00 AM  Depression screen PHQ 2/9  Decreased Interest 0 0 0  Down, Depressed,  Hopeless 1 0 0  PHQ - 2 Score 1 0 0  Altered sleeping  3 2  Tired, decreased energy  3 1  Change in appetite  3 1  Feeling bad or failure about yourself   0 0  Trouble concentrating  2 0  Moving slowly or fidgety/restless  2 0  Suicidal thoughts  0 0  PHQ-9 Score  13 4  Difficult doing work/chores  Somewhat difficult Not difficult at all    BP Readings from Last 3 Encounters:  02/20/24 114/84  08/12/23 130/84  06/27/23 (!) 124/90    Wt Readings from Last 3 Encounters:  02/20/24  225 lb (102.1 kg)  09/26/23 219 lb (99.3 kg)  06/27/23 219 lb (99.3 kg)    BP 114/84   Pulse 70   Temp 98.7 F (37.1 C)   Ht 5\' 6"  (1.676 m)   Wt 225 lb (102.1 kg)   SpO2 96%   BMI 36.32 kg/m   Physical Exam Vitals and nursing note reviewed.  Constitutional:      Appearance: Normal appearance.  Cardiovascular:     Rate and Rhythm: Normal rate and regular rhythm.     Heart sounds: No murmur heard.    No friction rub. No gallop.  Pulmonary:     Effort: Pulmonary effort is normal.     Breath sounds: Normal breath sounds.  Abdominal:     General: There is no distension.  Musculoskeletal:        General: Normal range of motion.  Skin:    General: Skin is warm and dry.  Neurological:     Mental Status: She is alert and oriented to person, place, and time.     Gait: Gait is intact.  Psychiatric:        Mood and Affect: Mood and affect normal.     Recent Labs     Component Value Date/Time   NA 138 05/26/2023 1653   K 4.2 05/26/2023 1653   CL 104 05/26/2023 1653   CO2 19 (L) 05/26/2023 1653   GLUCOSE 89 05/26/2023 1653   GLUCOSE 134 (H) 03/16/2022 0858   BUN 17 05/26/2023 1653   CREATININE 0.95 05/26/2023 1653   CALCIUM 9.1 05/26/2023 1653   PROT 7.2 05/26/2023 1653   ALBUMIN 4.4 05/26/2023 1653   AST 29 05/26/2023 1653   ALT 31 05/26/2023 1653   ALKPHOS 82 05/26/2023 1653   BILITOT 0.4 05/26/2023 1653   GFRNONAA >60 03/16/2022 0857    Lab Results  Component Value Date    WBC 6.5 05/26/2023   HGB 13.1 05/26/2023   HCT 40.1 05/26/2023   MCV 95 05/26/2023   PLT 326 05/26/2023   Lab Results  Component Value Date   HGBA1C 5.1 03/17/2022   Lab Results  Component Value Date   CHOL 173 03/17/2022   HDL 68 03/17/2022   LDLCALC 87 03/17/2022   TRIG 89 03/17/2022   CHOLHDL 2.5 03/17/2022   Lab Results  Component Value Date   TSH 2.870 05/26/2023     Assessment and Plan:  1. Anxiety (Primary) Increased recently.  Does not seem very responsive to SSRI/SNRI.  Continue temazepam as below, refilling today. - temazepam (RESTORIL) 15 MG capsule; Take 1 capsule (15 mg total) by mouth at bedtime as needed for sleep.  Dispense: 90 capsule; Refill: 1  2. Long-term current use of benzodiazepine Due for UDS today - Drug Screen, Urine  3. Hot flashes Check menopausal hormones as below - TSH - Prolactin - FSH/LH  4. Primary hypertension Refill Zestoretic, blood pressure well-controlled in clinic and at home.  - lisinopril-hydrochlorothiazide (ZESTORETIC) 20-12.5 MG tablet; Take 1 tablet by mouth daily.  Dispense: 90 tablet; Refill: 1 - Lipid panel - CBC with Differential/Platelet - Comprehensive metabolic panel  5. History of TIA (transient ischemic attack) Refill clopidogrel for long-term antiplatelet therapy.  Check lipids - clopidogrel (PLAVIX) 75 MG tablet; Take 1 tablet (75 mg total) by mouth daily.  Dispense: 90 tablet; Refill: 1 - Lipid panel  6. Sinus tachycardia Heart rate well-controlled with propranolol.  Refilling today - propranolol (INDERAL) 40 MG tablet; Take 1 tablet (40 mg  total) by mouth 2 (two) times daily as needed (for tachycardia).  Dispense: 180 tablet; Refill: 1   Return in about 3 months (around 05/22/2024) for OV f/u chronic conditions.    Alvester Morin, PA-C, DMSc, Nutritionist [facility] Primary Care and Sports [facility] [phone]

## 2024-02-21 ENCOUNTER — Encounter: Payer: Self-pay | Admitting: Physician Assistant

## 2024-02-21 DIAGNOSIS — R7401 Elevation of levels of liver transaminase levels: Secondary | ICD-10-CM | POA: Insufficient documentation

## 2024-02-21 LAB — CBC WITH DIFFERENTIAL/PLATELET
Basophils Absolute: 0.1 10*3/uL (ref 0.0–0.2)
Basos: 1 %
EOS (ABSOLUTE): 0.4 10*3/uL (ref 0.0–0.4)
Eos: 8 %
Hematocrit: 36.7 % (ref 34.0–46.6)
Hemoglobin: 12.2 g/dL (ref 11.1–15.9)
Immature Grans (Abs): 0 10*3/uL (ref 0.0–0.1)
Immature Granulocytes: 0 %
Lymphocytes Absolute: 1.7 10*3/uL (ref 0.7–3.1)
Lymphs: 34 %
MCH: 30.5 pg (ref 26.6–33.0)
MCHC: 33.2 g/dL (ref 31.5–35.7)
MCV: 92 fL (ref 79–97)
Monocytes Absolute: 0.4 10*3/uL (ref 0.1–0.9)
Monocytes: 7 %
Neutrophils Absolute: 2.5 10*3/uL (ref 1.4–7.0)
Neutrophils: 50 %
Platelets: 385 10*3/uL (ref 150–450)
RBC: 4 x10E6/uL (ref 3.77–5.28)
RDW: 12.8 % (ref 11.7–15.4)
WBC: 5 10*3/uL (ref 3.4–10.8)

## 2024-02-21 LAB — LIPID PANEL
Chol/HDL Ratio: 2.1 ratio (ref 0.0–4.4)
Cholesterol, Total: 200 mg/dL — ABNORMAL HIGH (ref 100–199)
HDL: 96 mg/dL (ref >39)
LDL Chol Calc (NIH): 80 mg/dL (ref 0–99)
Triglycerides: 147 mg/dL (ref 0–149)
VLDL Cholesterol Cal: 24 mg/dL (ref 5–40)

## 2024-02-21 LAB — COMPREHENSIVE METABOLIC PANEL
ALT: 66 IU/L — ABNORMAL HIGH (ref 0–32)
AST: 62 IU/L — ABNORMAL HIGH (ref 0–40)
Albumin: 4.1 g/dL (ref 3.8–4.9)
Alkaline Phosphatase: 119 IU/L (ref 44–121)
BUN/Creatinine Ratio: 13 (ref 9–23)
BUN: 10 mg/dL (ref 6–24)
Bilirubin Total: 0.4 mg/dL (ref 0.0–1.2)
CO2: 25 mmol/L (ref 20–29)
Calcium: 9.3 mg/dL (ref 8.7–10.2)
Chloride: 100 mmol/L (ref 96–106)
Creatinine, Ser: 0.77 mg/dL (ref 0.57–1.00)
Globulin, Total: 2.9 g/dL (ref 1.5–4.5)
Glucose: 82 mg/dL (ref 70–99)
Potassium: 4.7 mmol/L (ref 3.5–5.2)
Sodium: 140 mmol/L (ref 134–144)
Total Protein: 7 g/dL (ref 6.0–8.5)
eGFR: 93 mL/min/{1.73_m2} (ref >59)

## 2024-02-21 LAB — TSH: TSH: 1.95 u[IU]/mL (ref 0.450–4.500)

## 2024-02-21 LAB — FSH/LH
FSH: 85.4 m[IU]/mL
LH: 63 m[IU]/mL

## 2024-02-21 LAB — PROLACTIN: Prolactin: 7.3 ng/mL (ref 3.6–25.2)

## 2024-02-22 LAB — DRUG SCREEN, URINE
Amphetamines, Urine: NEGATIVE ng/mL
Barbiturate screen, urine: NEGATIVE ng/mL
Benzodiazepine Quant, Ur: NEGATIVE ng/mL
Cannabinoid Quant, Ur: NEGATIVE ng/mL
Cocaine (Metab.): NEGATIVE ng/mL
Opiate Quant, Ur: NEGATIVE ng/mL
PCP Quant, Ur: NEGATIVE ng/mL

## 2024-03-13 ENCOUNTER — Telehealth: Admitting: Physician Assistant

## 2024-03-16 ENCOUNTER — Telehealth: Admitting: Physician Assistant

## 2024-03-23 ENCOUNTER — Telehealth: Payer: Self-pay

## 2024-03-23 ENCOUNTER — Encounter: Payer: Self-pay | Admitting: Physician Assistant

## 2024-03-23 ENCOUNTER — Telehealth: Payer: Self-pay | Admitting: Physician Assistant

## 2024-03-23 ENCOUNTER — Telehealth: Admitting: Physician Assistant

## 2024-03-23 DIAGNOSIS — I10 Essential (primary) hypertension: Secondary | ICD-10-CM

## 2024-03-23 DIAGNOSIS — K580 Irritable bowel syndrome with diarrhea: Secondary | ICD-10-CM

## 2024-03-23 DIAGNOSIS — N951 Menopausal and female climacteric states: Secondary | ICD-10-CM

## 2024-03-23 DIAGNOSIS — E66812 Obesity, class 2: Secondary | ICD-10-CM

## 2024-03-23 MED ORDER — ZEPBOUND 2.5 MG/0.5ML ~~LOC~~ SOAJ
2.5000 mg | SUBCUTANEOUS | 0 refills | Status: DC
Start: 2024-03-23 — End: 2024-04-16

## 2024-03-23 MED ORDER — CLONIDINE HCL 0.1 MG PO TABS
0.1000 mg | ORAL_TABLET | Freq: Two times a day (BID) | ORAL | 1 refills | Status: DC
Start: 2024-03-23 — End: 2024-05-18

## 2024-03-23 NOTE — Telephone Encounter (Signed)
 Please initiate prior auth for Zepbound. Dx Class 2 severe obesity with comorbidities of HTN, cerebrovascular disease, HLD.

## 2024-03-23 NOTE — Telephone Encounter (Signed)
 PA completed waiting on insurance approval.  Key: [id number]  [id number]

## 2024-03-23 NOTE — Progress Notes (Signed)
 Date:  03/23/2024   Name:  Shirley Cameron   DOB:  03-Sep-1971   MRN:  [id number]   I connected with Purcell Nails on 03/24/24 via MyChart Video and verified that I am speaking with the correct person using appropriate identifiers. The limitations, risks, security and privacy concerns of performing an evaluation and management service by MyChart Video, including the higher likelihood of inaccurate diagnoses and treatments, and the availability of in person appointments were reviewed. The possible need of an additional face-to-face encounter for complete and high quality delivery of care was discussed. The patient was also made aware that there may be a patient responsible charge related to this service. The patient expressed understanding and wishes to proceed.   Provider location is in medical facility [facility] Primary Care and Sports Medicine at [facility]). Patient location is at their place of work People involved in care of the patient during this telehealth encounter were myself, my CMA, and my front office/scheduling team member.    Chief Complaint: Medical Management of Chronic Issues (Go over results )  HPI Shirley Cameron presents virtually today to discuss some of her chronic conditions including hot flashes (which are constant), weight gain, abdominal bloating, uncontrolled IBS with diarrhea, and worsening hypertension with systolic around 150 mmHg.  Last visit we checked hormones, suggesting she is menopausal (s/p partial hysterectomy).  Unfortunately due to her history of TIA/CVA she is not a candidate for hormone replacement therapy.  For this reason she would like to discuss other options.  She feels that no matter how much she exercises or how little she eats, she cannot lose weight.  She has been tracking calories at less than 1200 daily.   Medication list has been reviewed and updated.  Current Meds  Medication Sig   cloNIDine (CATAPRES) 0.1 MG tablet Take 1 tablet  (0.1 mg total) by mouth 2 (two) times daily.   clopidogrel (PLAVIX) 75 MG tablet Take 1 tablet (75 mg total) by mouth daily.   dicyclomine (BENTYL) 20 MG tablet Take 1 tablet (20 mg total) by mouth every 6 (six) hours as needed for spasms (IBS).   hydrOXYzine (ATARAX) 50 MG tablet TAKE 1 TABLET (50 MG TOTAL) BY MOUTH 3 (THREE) TIMES DAILY AS NEEDED FOR ITCHING OR ANXIETY.   lisinopril-hydrochlorothiazide (ZESTORETIC) 20-12.5 MG tablet Take 1 tablet by mouth daily.   ondansetron (ZOFRAN) 8 MG tablet Take 1 tablet (8 mg total) by mouth every 8 (eight) hours as needed for nausea or vomiting.   pravastatin (PRAVACHOL) 40 MG tablet Take 1 tablet (40 mg total) by mouth daily at 6 PM.   promethazine (PHENERGAN) 25 MG tablet Take 1 tablet (25 mg total) by mouth every 6 (six) hours as needed for nausea or vomiting.   propranolol (INDERAL) 40 MG tablet Take 1 tablet (40 mg total) by mouth 2 (two) times daily as needed (for tachycardia).   temazepam (RESTORIL) 15 MG capsule Take 1 capsule (15 mg total) by mouth at bedtime as needed for sleep.   ZEPBOUND 2.5 MG/0.5ML Pen Inject 2.5 mg into the skin once a week. Contact Dan before refill.     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 06/02/2023   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   Irritable bowel syndrome with diarrhea 05/27/2023   History of vitamin D  deficiency 05/27/2023   Class 2 severe obesity with serious comorbidity and body mass index (BMI) of 35.0 to 35.9 in adult [facility]) 05/27/2023   History of CVA (cerebrovascular accident) 03/16/2022   Abnormal mammogram of left breast 06/01/2018   Sinus tachycardia 10/12/2017   Chronic nausea 01/28/2017   Insomnia 11/30/2016   Bipolar disorder (HCC) 10/23/2016   Attention-deficit hyperactivity disorder, combined  type 09/24/2016   Anxiety 09/24/2016   Chronic headaches 09/24/2016   GERD (gastroesophageal reflux disease) 09/24/2016   PTSD (post-traumatic stress disorder) 09/24/2016   Hypertension 07/27/2016   Mild intermittent asthma without complication 07/27/2016   Patellofemoral dysfunction of right knee 03/16/2016   Primary osteoarthritis of right knee 02/06/2016   Iron deficiency anemia 11/04/2014   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    Allergies  Allergen Reactions   Ibuprofen Other (See Comments)    Gastric bypass, Gastric bypass   Nsaids     H/o gastric bypass   Penicillins Hives   Penicillin V Potassium Other (See Comments)    Childhood allergy - unknown reaction   Cephalexin Itching and Rash   Sulfamethoxazole-Trimethoprim Itching and Rash    Immunization History  Administered Date(s) Administered   Tdap 05/20/2016    Past Surgical History:  Procedure Laterality Date   ABDOMINAL HYSTERECTOMY     ARTHROSCOPIC REPAIR ACL     BREAST SURGERY     CHOLECYSTECTOMY     CHONDROPLASTY Right 10/18/2019   Procedure: CHONDROPLASTY;  Surgeon: Bjorn Pippin, MD;  Location: [facility];  Service: Orthopedics;  Laterality: Right;   EXPLORATORY LAPAROTOMY     FRACTURE SURGERY     ankle   GASTRIC ROUX-EN-Y  12/19/2008   HARDWARE REMOVAL     KNEE ARTHROSCOPY WITH LATERAL MENISECTOMY Right 10/18/2019   Procedure: KNEE ARTHROSCOPY WITH LATERAL MENISECTOMY;  Surgeon: Bjorn Pippin, MD;  Location: [facility];  Service: Orthopedics;  Laterality: Right;   OSTEOTOMY AND TIBIAL SHORTENING     TONSILLECTOMY     TOTAL KNEE ARTHROPLASTY Right 12/07/2021   Procedure: TOTAL KNEE ARTHROPLASTY;  Surgeon: Joen Laura, MD;  Location: [facility];  Service: Orthopedics;  Laterality: Right;   WISDOM TOOTH EXTRACTION      Social History   Tobacco Use   Smoking status: Never   Smokeless tobacco: Never  Vaping Use   Vaping status: Never Used  Substance  Use Topics   Alcohol use: Yes    Comment: rare   Drug use: Never    Family History  Problem Relation Age of Onset   Breast cancer Mother    Hypertension Mother    Hypertension Father    Bladder Cancer Father    Hypertension Sister    Breast cancer Maternal Grandmother    Hypertension Maternal Grandmother    Hypertension Maternal Grandfather    Stroke Paternal Grandmother    Hypertension Paternal Grandmother    Heart disease Paternal Grandmother    Hypertension Paternal Grandfather         03/23/2024    1:21 PM 02/20/2024    3:07 PM 09/26/2023    8:12 AM 08/12/2023    9:00 AM  GAD 7 : Generalized Anxiety Score  Nervous, Anxious, on Edge 0 2 3 2   Control/stop worrying 0 2 3 2   Worry too much - different things 0 2 3 2   Trouble relaxing 0 2 3 1   Restless 0 0 3 0  Easily annoyed or irritable 1 0 0 0  Afraid -  awful might happen 0 1 1 0  Total GAD 7 Score 1 9 16 7   Anxiety Difficulty Not difficult at all Not difficult at all Somewhat difficult Not difficult at all       03/23/2024    1:20 PM 02/20/2024    3:07 PM 09/26/2023    8:11 AM  Depression screen PHQ 2/9  Decreased Interest 0 0 0  Down, Depressed, Hopeless 0 1 0  PHQ - 2 Score 0 1 0  Altered sleeping   3  Tired, decreased energy   3  Change in appetite   3  Feeling bad or failure about yourself    0  Trouble concentrating   2  Moving slowly or fidgety/restless   2  Suicidal thoughts   0  PHQ-9 Score   13  Difficult doing work/chores   Somewhat difficult    BP Readings from Last 3 Encounters:  02/20/24 114/84  08/12/23 130/84  06/27/23 (!) 124/90    Wt Readings from Last 3 Encounters:  02/20/24 225 lb (102.1 kg)  09/26/23 219 lb (99.3 kg)  06/27/23 219 lb (99.3 kg)    There were no vitals taken for this visit.  Physical Exam General: Speaking full sentences, no audible heavy breathing. Sounds alert and appropriately interactive. Well-appearing. Face symmetric. Extraocular movements intact. Pupils equal  and round. No nasal flaring or accessory muscle use visualized.  Recent Labs     Component Value Date/Time   NA 140 02/20/2024 1544   K 4.7 02/20/2024 1544   CL 100 02/20/2024 1544   CO2 25 02/20/2024 1544   GLUCOSE 82 02/20/2024 1544   GLUCOSE 134 (H) 03/16/2022 0858   BUN 10 02/20/2024 1544   CREATININE 0.77 02/20/2024 1544   CALCIUM 9.3 02/20/2024 1544   PROT 7.0 02/20/2024 1544   ALBUMIN 4.1 02/20/2024 1544   AST 62 (H) 02/20/2024 1544   ALT 66 (H) 02/20/2024 1544   ALKPHOS 119 02/20/2024 1544   BILITOT 0.4 02/20/2024 1544   GFRNONAA >60 03/16/2022 0857    Lab Results  Component Value Date   WBC 5.0 02/20/2024   HGB 12.2 02/20/2024   HCT 36.7 02/20/2024   MCV 92 02/20/2024   PLT 385 02/20/2024   Lab Results  Component Value Date   HGBA1C 5.1 03/17/2022   Lab Results  Component Value Date   CHOL 200 (H) 02/20/2024   HDL 96 02/20/2024   LDLCALC 80 02/20/2024   TRIG 147 02/20/2024   CHOLHDL 2.1 02/20/2024   Lab Results  Component Value Date   TSH 1.950 02/20/2024     Assessment and Plan:  1. Primary hypertension (Primary) Will try low-dose clonidine twice daily in an effort to treat both the HTN and hot flashes at the same time.  Patient advised that sudden cessation or disruption of this medication is likely to result in rebound hypertension. - cloNIDine (CATAPRES) 0.1 MG tablet; Take 1 tablet (0.1 mg total) by mouth 2 (two) times daily.  Dispense: 60 tablet; Refill: 1  2. Hot flashes, menopausal Consider venlafaxine, but this patient is previously used this medication and did not tolerate well.  Also considered gabapentin, though this would not be ideal if she is concerned for weight gain.  Considered Veozah but this would not likely help her blood pressures or weight. - cloNIDine (CATAPRES) 0.1 MG tablet; Take 1 tablet (0.1 mg total) by mouth 2 (two) times daily.  Dispense: 60 tablet; Refill: 1  3. Class 2 severe obesity with  serious comorbidity and body  mass index (BMI) of 35.0 to 35.9 in adult, unspecified obesity type (HCC) We have previously shied away from GLP-1 RA in this patient who is s/p Roux-en-Y bypass with a documented history of multi nutrient malnutrition.  However, because she is not a candidate for phentermine, intolerant to bupropion, and should not take metformin with existing bowel issues, I feel that GLP-1 RA is the most appropriate option at this time. - ZEPBOUND 2.5 MG/0.5ML Pen; Inject 2.5 mg into the skin once a week. Contact Dan before refill.  Dispense: 2 mL; Refill: 0  4. Irritable bowel syndrome with diarrhea Refer to GI for further evaluation - Ambulatory referral to Gastroenterology    Patient due to return in about 2-3 months for in-office visit with repeat LFTs.  Reminded to schedule this appointment.  I discussed the above assessment and treatment plan with the patient. The patient was provided an opportunity to ask questions and all were answered. The patient agreed with the plan and demonstrated an understanding of the instructions. The patient was advised to call back or seek an in-person evaluation if the symptoms worsen or if the condition fails to improve as anticipated. I provided a total time of 25 minutes inclusive of time utilized for medical chart review, information gathering, care coordination with staff, and documentation completion.  Alvester Morin, PA-C, DMSc, Nutritionist [facility] Primary Care and Sports Medicine [facility] [phone]

## 2024-03-26 NOTE — Telephone Encounter (Signed)
 PA completed  KP

## 2024-03-26 NOTE — Telephone Encounter (Signed)
 Your PA request has been approved. Additional information will be provided in the approval communication. (Message 1145). Authorization Expiration Date: November 25, 2024.

## 2024-03-26 NOTE — Telephone Encounter (Signed)
 New PA completed waiting on insurance approval.  Key: [id number]  [id number]

## 2024-04-14 ENCOUNTER — Other Ambulatory Visit: Payer: Self-pay | Admitting: Physician Assistant

## 2024-04-14 DIAGNOSIS — N951 Menopausal and female climacteric states: Secondary | ICD-10-CM

## 2024-04-14 DIAGNOSIS — I10 Essential (primary) hypertension: Secondary | ICD-10-CM

## 2024-04-16 ENCOUNTER — Other Ambulatory Visit: Payer: Self-pay | Admitting: Physician Assistant

## 2024-04-16 DIAGNOSIS — F419 Anxiety disorder, unspecified: Secondary | ICD-10-CM

## 2024-04-16 DIAGNOSIS — E66812 Obesity, class 2: Secondary | ICD-10-CM

## 2024-04-16 MED ORDER — ZEPBOUND 5 MG/0.5ML ~~LOC~~ SOAJ
5.0000 mg | SUBCUTANEOUS | 0 refills | Status: AC
Start: 2024-04-16 — End: ?

## 2024-04-16 MED ORDER — TEMAZEPAM 15 MG PO CAPS
ORAL_CAPSULE | ORAL | 2 refills | Status: AC
Start: 2024-04-16 — End: ?

## 2024-04-16 NOTE — Telephone Encounter (Signed)
 Requested medication (s) are due for refill today: no  Requested medication (s) are on the active medication list: yes  Last refill: 03/23/24 #60 1 RF  Future visit scheduled: no  Notes to clinic:  pharmacy requested 90 day refill  Requested Prescriptions  Pending Prescriptions Disp Refills   cloNIDine  (CATAPRES ) 0.1 MG tablet [Pharmacy Med Name: CLONIDINE  HCL 0.1 MG TABLET] 180 tablet 1    Sig: TAKE 1 TABLET BY MOUTH 2 TIMES DAILY.     Cardiovascular:  Alpha-2 Agonists Passed - 04/16/2024 12:17 PM      Passed - Last BP in normal range    BP Readings from Last 1 Encounters:  02/20/24 114/84         Passed - Last Heart Rate in normal range    Pulse Readings from Last 1 Encounters:  02/20/24 70         Passed - Valid encounter within last 6 months    Recent Outpatient Visits           3 weeks ago Primary hypertension   [facility] Primary Care & [facility] Medicine at [facility], [place], [place]   1 month ago Anxiety   [facility] & [facility] Medicine at [facility], [place], [place]

## 2024-04-16 NOTE — Telephone Encounter (Signed)
 Please review.  KP

## 2024-05-16 ENCOUNTER — Other Ambulatory Visit: Payer: Self-pay | Admitting: Physician Assistant

## 2024-05-16 DIAGNOSIS — I10 Essential (primary) hypertension: Secondary | ICD-10-CM

## 2024-05-16 DIAGNOSIS — N951 Menopausal and female climacteric states: Secondary | ICD-10-CM

## 2024-05-18 NOTE — Telephone Encounter (Signed)
 Requested Prescriptions  Pending Prescriptions Disp Refills   cloNIDine  (CATAPRES ) 0.1 MG tablet [Pharmacy Med Name: CLONIDINE  HCL 0.1 MG TABLET] 60 tablet 1    Sig: TAKE 1 TABLET BY MOUTH 2 TIMES DAILY.     Cardiovascular:  Alpha-2 Agonists Passed - 05/18/2024 10:53 AM      Passed - Last BP in normal range    BP Readings from Last 1 Encounters:  02/20/24 114/84         Passed - Last Heart Rate in normal range    Pulse Readings from Last 1 Encounters:  02/20/24 70         Passed - Valid encounter within last 6 months    Recent Outpatient Visits           1 month ago Primary hypertension   [place] Primary Care & Sports Medicine at [facility], [place], [place]   2 months ago Anxiety   [facility] Primary Care & [facility] Medicine at [facility], [place], [place]

## 2024-05-21 ENCOUNTER — Emergency Department (HOSPITAL_COMMUNITY)
Admission: EM | Admit: 2024-05-21 | Discharge: 2024-05-21 | Disposition: A | Discharge status: Home / Self Care | Payer: Self-pay | Attending: Student | Admitting: Student

## 2024-05-21 ENCOUNTER — Emergency Department (HOSPITAL_COMMUNITY): Payer: Self-pay

## 2024-05-21 DIAGNOSIS — R29818 Other symptoms and signs involving the nervous system: Secondary | ICD-10-CM

## 2024-05-21 DIAGNOSIS — R29810 Facial weakness: Secondary | ICD-10-CM | POA: Diagnosis not present

## 2024-05-21 DIAGNOSIS — I6521 Occlusion and stenosis of right carotid artery: Secondary | ICD-10-CM | POA: Diagnosis not present

## 2024-05-21 DIAGNOSIS — Z85818 Personal history of malignant neoplasm of other sites of lip, oral cavity, and pharynx: Secondary | ICD-10-CM | POA: Insufficient documentation

## 2024-05-21 DIAGNOSIS — I771 Stricture of artery: Secondary | ICD-10-CM | POA: Diagnosis not present

## 2024-05-21 DIAGNOSIS — R0789 Other chest pain: Secondary | ICD-10-CM | POA: Diagnosis not present

## 2024-05-21 DIAGNOSIS — Z8673 Personal history of transient ischemic attack (TIA), and cerebral infarction without residual deficits: Secondary | ICD-10-CM | POA: Insufficient documentation

## 2024-05-21 DIAGNOSIS — J45909 Unspecified asthma, uncomplicated: Secondary | ICD-10-CM | POA: Insufficient documentation

## 2024-05-21 DIAGNOSIS — G43109 Migraine with aura, not intractable, without status migrainosus: Secondary | ICD-10-CM

## 2024-05-21 DIAGNOSIS — Z96651 Presence of right artificial knee joint: Secondary | ICD-10-CM | POA: Insufficient documentation

## 2024-05-21 DIAGNOSIS — Z79899 Other long term (current) drug therapy: Secondary | ICD-10-CM | POA: Insufficient documentation

## 2024-05-21 DIAGNOSIS — R4701 Aphasia: Secondary | ICD-10-CM | POA: Diagnosis not present

## 2024-05-21 DIAGNOSIS — Z5329 Procedure and treatment not carried out because of patient's decision for other reasons: Secondary | ICD-10-CM | POA: Insufficient documentation

## 2024-05-21 DIAGNOSIS — I10 Essential (primary) hypertension: Secondary | ICD-10-CM | POA: Diagnosis not present

## 2024-05-21 DIAGNOSIS — R4781 Slurred speech: Secondary | ICD-10-CM | POA: Diagnosis not present

## 2024-05-21 DIAGNOSIS — R519 Headache, unspecified: Secondary | ICD-10-CM | POA: Insufficient documentation

## 2024-05-21 DIAGNOSIS — R079 Chest pain, unspecified: Secondary | ICD-10-CM | POA: Diagnosis not present

## 2024-05-21 DIAGNOSIS — Z8541 Personal history of malignant neoplasm of cervix uteri: Secondary | ICD-10-CM | POA: Insufficient documentation

## 2024-05-21 LAB — DIFFERENTIAL
Abs Immature Granulocytes: 0.04 10*3/uL (ref 0.00–0.07)
Basophils Absolute: 0.1 10*3/uL (ref 0.0–0.1)
Basophils Relative: 1 %
Eosinophils Absolute: 0.3 10*3/uL (ref 0.0–0.5)
Eosinophils Relative: 4 %
Immature Granulocytes: 0 %
Lymphocytes Relative: 17 %
Lymphs Abs: 1.6 10*3/uL (ref 0.7–4.0)
Monocytes Absolute: 0.7 10*3/uL (ref 0.1–1.0)
Monocytes Relative: 7 %
Neutro Abs: 6.7 10*3/uL (ref 1.7–7.7)
Neutrophils Relative %: 71 %

## 2024-05-21 LAB — CBC
HCT: 39.9 % (ref 36.0–46.0)
Hemoglobin: 13.1 g/dL (ref 12.0–15.0)
MCH: 31 pg (ref 26.0–34.0)
MCHC: 32.8 g/dL (ref 30.0–36.0)
MCV: 94.5 fL (ref 80.0–100.0)
Platelets: 335 10*3/uL (ref 150–400)
RBC: 4.22 MIL/uL (ref 3.87–5.11)
RDW: 12.4 % (ref 11.5–15.5)
WBC: 9.4 10*3/uL (ref 4.0–10.5)
nRBC: 0 % (ref 0.0–0.2)

## 2024-05-21 LAB — I-STAT CHEM 8, ED
BUN: 17 mg/dL (ref 6–20)
Calcium, Ion: 0.95 mmol/L — ABNORMAL LOW (ref 1.15–1.40)
Chloride: 105 mmol/L (ref 98–111)
Creatinine, Ser: 0.9 mg/dL (ref 0.44–1.00)
Glucose, Bld: 105 mg/dL — ABNORMAL HIGH (ref 70–99)
HCT: 39 % (ref 36.0–46.0)
Hemoglobin: 13.3 g/dL (ref 12.0–15.0)
Potassium: 6.4 mmol/L (ref 3.5–5.1)
Sodium: 135 mmol/L (ref 135–145)
TCO2: 24 mmol/L (ref 22–32)

## 2024-05-21 LAB — CBG MONITORING, ED: Glucose-Capillary: 102 mg/dL — ABNORMAL HIGH (ref 70–99)

## 2024-05-21 MED ORDER — PROCHLORPERAZINE EDISYLATE 10 MG/2ML IJ SOLN
10.0000 mg | Freq: Once | INTRAMUSCULAR | Status: AC
  Administered 2024-05-21: 10 mg via INTRAVENOUS
  Filled 2024-05-21: qty 2

## 2024-05-21 MED ORDER — LACTATED RINGERS IV BOLUS
1000.0000 mL | Freq: Once | INTRAVENOUS | Status: AC
  Administered 2024-05-21: 1000 mL via INTRAVENOUS

## 2024-05-21 MED ORDER — SODIUM CHLORIDE 0.9% FLUSH
3.0000 mL | Freq: Once | INTRAVENOUS | Status: AC
  Administered 2024-05-21: 3 mL via INTRAVENOUS

## 2024-05-21 MED ORDER — DIPHENHYDRAMINE HCL 50 MG/ML IJ SOLN
25.0000 mg | Freq: Once | INTRAMUSCULAR | Status: AC
  Administered 2024-05-21: 25 mg via INTRAVENOUS
  Filled 2024-05-21: qty 1

## 2024-05-21 MED ORDER — IOHEXOL 350 MG/ML SOLN
75.0000 mL | Freq: Once | INTRAVENOUS | Status: AC | PRN
  Administered 2024-05-21: 75 mL via INTRAVENOUS

## 2024-05-21 NOTE — ED Notes (Signed)
 Pt has taken off all monitoring devices.  Pt is irritable at this time stating to speak to MD Kommor as she will not do any "more testing and wants to go home now."  Husband at bedside.  MD Kommor notified and aware of refusal for further monitoring and tests.

## 2024-05-21 NOTE — ED Notes (Signed)
 AMA e-signed by patient and this RN

## 2024-05-21 NOTE — Consult Note (Signed)
 NEUROLOGY CONSULT NOTE   Date of service: May 21, 2024 Patient Name: Shirley Cameron MRN:  [id number] DOB:  29-Dec-1970 Chief Complaint: "Difficulty speaking, chest pain and generalized weakness" Requesting Provider: Karlyn Overman, MD  History of Present Illness  Shirley Cameron is a 53 y.o. female with hx of hypertension, anxiety, TIA, strokelike episode status post TNK with negative MRI, IBS, vitamin B12 deficiency, obesity, bipolar disorder, ADHD, GERD, arthritis and gastric bypass who presents with sudden onset difficulty speaking, chest pain and generalized weakness.  Patient was in her usual state of health and was at work speaking with her husband on the phone when she suddenly developed difficulty speaking with a stuttering speech pattern and left-sided weakness which then generalized to right-sided weakness as well.  She also complained of a headache and pain in her chest she also complained of some chest pain.  She states she has not been ill recently and has had recent palpitations but no other symptoms. She has past neurological history significant for strokelike episode treated with IV TNK in March 2023 with negative neurovascular imaging.  She has had recurrent transient episodes of dizziness speech difficulties and vertigo exact etiology thought to be unclear vertebrobasilar TIAs given hypoplastic posterior circulation or atypical migraine episodes.  She was last seen by me in the office on 08/11/2022. Emergent CT head code stroke was negative for acute abnormality and CT angiogram of the brain and neck shows no large vessel stenosis or occlusion. LKW: 1515 Modified rankin score: 1-No significant post stroke disability and can perform usual duties with stroke symptoms IV Thrombolysis: No, symptoms not likely to be due to stroke EVT: No, no LVO  NIHSS components Score: Comment  1a Level of Conscious 0[x]  1[]  2[]  3[]      1b LOC Questions 0[]  1[x]  2[]       1c LOC Commands 0[x]  1[]   2[]       2 Best Gaze 0[x]  1[]  2[]       3 Visual 0[x]  1[]  2[]  3[]      4 Facial Palsy 0[x]  1[]  2[]  3[]      5a Motor Arm - left 0[]  1[x]  2[]  3[]  4[]  UN[]    5b Motor Arm - Right 0[]  1[x]  2[]  3[]  4[]  UN[]    6a Motor Leg - Left 0[]  1[]  2[x]  3[]  4[]  UN[]    6b Motor Leg - Right 0[]  1[]  2[x]  3[]  4[]  UN[]    7 Limb Ataxia 0[x]  1[]  2[]  UN[]      8 Sensory 0[x]  1[]  2[]  UN[]      9 Best Language 0[x]  1[]  2[]  3[]      10 Dysarthria 0[]  1[x]  2[]  UN[]      11 Extinct. and Inattention 0[x]  1[]  2[]       TOTAL:6       ROS  Comprehensive ROS performed and pertinent positives documented in HPI   Past History   Past Medical History:  Diagnosis Date   Anemia    Anxiety    Arthritis    Asthma    Cancer (HCC)    oral and cervical   Complication of anesthesia    very slow/ hard to wake up and usually has itching   Depression    Elevated liver enzymes    GERD (gastroesophageal reflux disease)    Headache    Hyperlipidemia    Hypertension    Pneumonia    PTSD (post-traumatic stress disorder)    Stroke (HCC) 03/18/2022    Past Surgical History:  Procedure Laterality Date   ABDOMINAL HYSTERECTOMY  ARTHROSCOPIC REPAIR ACL     BREAST SURGERY     CHOLECYSTECTOMY     CHONDROPLASTY Right 10/18/2019   Procedure: CHONDROPLASTY;  Surgeon: Micheline Ahr, MD;  Location: [facility];  Service: Orthopedics;  Laterality: Right;   EXPLORATORY LAPAROTOMY     FRACTURE SURGERY     ankle   GASTRIC ROUX-EN-Y  12/19/2008   HARDWARE REMOVAL     KNEE ARTHROSCOPY WITH LATERAL MENISECTOMY Right 10/18/2019   Procedure: KNEE ARTHROSCOPY WITH LATERAL MENISECTOMY;  Surgeon: Micheline Ahr, MD;  Location: [facility];  Service: Orthopedics;  Laterality: Right;   OSTEOTOMY AND TIBIAL SHORTENING     TONSILLECTOMY     TOTAL KNEE ARTHROPLASTY Right 12/07/2021   Procedure: TOTAL KNEE ARTHROPLASTY;  Surgeon: Murleen Arms, MD;  Location: [facility];  Service: Orthopedics;  Laterality: Right;    WISDOM TOOTH EXTRACTION      Family History: Family History  Problem Relation Age of Onset   Breast cancer Mother    Hypertension Mother    Hypertension Father    Bladder Cancer Father    Hypertension Sister    Breast cancer Maternal Grandmother    Hypertension Maternal Grandmother    Hypertension Maternal Grandfather    Stroke Paternal Grandmother    Hypertension Paternal Grandmother    Heart disease Paternal Grandmother    Hypertension Paternal Grandfather     Social History  reports that she has never smoked. She has never used smokeless tobacco. She reports current alcohol use. She reports that she does not use drugs.  Allergies  Allergen Reactions   Ibuprofen Other (See Comments)    Gastric bypass, Gastric bypass   Nsaids     H/o gastric bypass   Penicillins Hives   Penicillin V Potassium Other (See Comments)    Childhood allergy - unknown reaction   Cephalexin Itching and Rash   Sulfamethoxazole-Trimethoprim Itching and Rash    Medications   Current Facility-Administered Medications:    diphenhydrAMINE  (BENADRYL ) injection 25 mg, 25 mg, Intravenous, Once, Kommor, Madison, MD   lactated ringers  bolus 1,000 mL, 1,000 mL, Intravenous, Once, Kommor, Madison, MD   prochlorperazine (COMPAZINE) injection 10 mg, 10 mg, Intravenous, Once, Kommor, Madison, MD  Current Outpatient Medications:    cloNIDine  (CATAPRES ) 0.1 MG tablet, TAKE 1 TABLET BY MOUTH 2 TIMES DAILY., Disp: 60 tablet, Rfl: 1   clopidogrel  (PLAVIX ) 75 MG tablet, Take 1 tablet (75 mg total) by mouth daily., Disp: 90 tablet, Rfl: 1   dicyclomine  (BENTYL ) 20 MG tablet, Take 1 tablet (20 mg total) by mouth every 6 (six) hours as needed for spasms (IBS)., Disp: 120 tablet, Rfl: 2   hydrOXYzine  (ATARAX ) 50 MG tablet, TAKE 1 TABLET (50 MG TOTAL) BY MOUTH 3 (THREE) TIMES DAILY AS NEEDED FOR ITCHING OR ANXIETY., Disp: 270 tablet, Rfl: 1   lisinopril -hydrochlorothiazide  (ZESTORETIC ) 20-12.5 MG tablet, Take 1  tablet by mouth daily., Disp: 90 tablet, Rfl: 1   ondansetron  (ZOFRAN ) 8 MG tablet, Take 1 tablet (8 mg total) by mouth every 8 (eight) hours as needed for nausea or vomiting., Disp: 20 tablet, Rfl: 2   pravastatin  (PRAVACHOL ) 40 MG tablet, Take 1 tablet (40 mg total) by mouth daily at 6 PM., Disp: 30 tablet, Rfl: 2   promethazine  (PHENERGAN ) 25 MG tablet, Take 1 tablet (25 mg total) by mouth every 6 (six) hours as needed for nausea or vomiting., Disp: 90 tablet, Rfl: 2   propranolol  (INDERAL ) 40 MG tablet, Take 1 tablet (  40 mg total) by mouth 2 (two) times daily as needed (for tachycardia)., Disp: 180 tablet, Rfl: 1   temazepam  (RESTORIL ) 15 MG capsule, TAKE 1-2 CAPSULES BY MOUTH 2 (TWO) TIMES DAILY AS NEEDED FOR SLEEP (MAX 45 MG PER DAY). MAY USE ONE CAPSULE (15 MG) FOR PANIC SYMPTOMS DURING THE DAY BUT MAY CAUSE SEDATION., Disp: 90 capsule, Rfl: 2   ZEPBOUND  5 MG/0.5ML Pen, Inject 5 mg into the skin once a week., Disp: 2 mL, Rfl: 0  Vitals   Vitals:   Jun 20, 2024 1600 06-20-24 1709  BP:  (!) 139/108  Pulse:  91  Resp:  17  Temp:  98.8 F (37.1 C)  TempSrc:  Oral  SpO2:  99%  Weight: 103.6 kg     Body mass index is 36.86 kg/m.  Physical Exam   Constitutional: Appears well-developed and well-nourished.  She appears anxious. Psych: Somewhat anxious Eyes: No scleral injection.  HENT: No OP obstruction.  Head: Normocephalic.  Respiratory: Effort normal, non-labored breathing.  Skin: WDI.   Neurologic Examination    NEURO:  Mental Status: Alert and oriented to person place time and situation, somewhat anxious appearing but can give history of present illness Speech/Language: speech is in a stuttering pattern with mild dysarthria.  She is able to repeat phrases and name objects without difficulty  Cranial Nerves:  II: PERRL. Visual fields full.  III, IV, VI: EOMI. Eyelids elevate symmetrically.  V: Sensation is intact to light touch and symmetrical to face.  VII: Smile is  symmetrical.  VIII: hearing intact to voice. IX, X: Voice is slightly dysarthric NG:[id number] shrug 5/5. XII: tongue is midline without fasciculations. Motor: Able to move bilateral upper and lower extremities with antigravity strength, although drift present throughout.  Effort is variable, and strength is greater with encouragement Tone: is normal and bulk is normal Sensation- Intact to light touch bilaterally.  Gait- deferred   Labs/Imaging/Neurodiagnostic studies   CBC:  Recent Labs  Lab 2024/06/20 1642 20-Jun-2024 1650  WBC 9.4  --   NEUTROABS 6.7  --   HGB 13.1 13.3  HCT 39.9 39.0  MCV 94.5  --   PLT 335  --    Basic Metabolic Panel:  Lab Results  Component Value Date   NA 135 06/20/2024   K 6.4 (HH) 2024-06-20   CO2 25 02/20/2024   GLUCOSE 105 (H) 20-Jun-2024   BUN 17 06-20-24   CREATININE 0.90 Jun 20, 2024   CALCIUM 9.3 02/20/2024   GFRNONAA >60 03/16/2022   Lipid Panel:  Lab Results  Component Value Date   LDLCALC 80 02/20/2024   HgbA1c:  Lab Results  Component Value Date   HGBA1C 5.1 03/17/2022   Urine Drug Screen:     Component Value Date/Time   COCAINSCRNUR Negative 02/20/2024 1617    Alcohol Level No results found for: "ETH" INR  Lab Results  Component Value Date   INR 0.9 03/16/2022   APTT  Lab Results  Component Value Date   APTT 21 (L) 03/16/2022   AED levels: No results found for: "PHENYTOIN", "ZONISAMIDE", "LAMOTRIGINE", "LEVETIRACETA"  CT Head without contrast(Personally reviewed): No acute abnormality  CT angio Head and Neck with contrast(Personally reviewed): No LVO   ASSESSMENT   Shirley Cameron is a 53 y.o. female  with hx of hypertension, anxiety, TIA, strokelike episode status post TNK with negative MRI, IBS, vitamin B12 deficiency, obesity, bipolar disorder, ADHD, GERD, arthritis and gastric bypass who presents with sudden onset weakness and difficulty speaking along with headache  and chest pain.  Patient was in her usual  state of health at work earlier today and reports sudden onset of the symptoms.  Speech is in a stuttering pattern, and patient is able to name objects and repeat phrases.  Weakness reportedly started in the left and then spread to the right side.  Strength of extremities is noted to be symmetrical with drift in both arms and legs, but effort noted to be variable and increases with encouragement.  Patient has a previous presentation for a  MRI negative stroke in 2023 with symptoms of acute onset of vertigo and left-sided weakness.   Since then, she has had recurrent transient episodes of dizziness, vertigo and speech difficulties which were thought to be likely atypical migraines versus vertebrobasilar TIAs. Current symptoms are nonfocal and variable, and are more likely indicative of a complicated migraine or functional neurological disorder rather than stroke.  RECOMMENDATIONS  -Treatment of headache and chest pain per primary team -Obtain MRI if symptoms do not improve ______________________________________________________________________    Signed, Cortney Alvin Axe, NP [facility]   I have personally obtained history,examined this patient, reviewed notes, independently viewed imaging studies, participated in medical decision making and plan of care.ROS completed by me personally and pertinent positives fully documented  I have made any additions or clarifications directly to the above note. Agree with note above.  Patient presented with sudden onset of speech difficulties and initially left-sided weakness when seen by EMS and when they were drawing blood from the right arm she complained of right sided weakness as well.  Her neurological exam significant for anxiety as well as variable and poor effort both speech extremity strength testing without clear-cut focal lateralizing symptoms.  Emergent CT head and CT angiogram of the brain and neck are both unremarkable.  She has had  somewhat similar presentation in 2023 and received IV TNK with negative neurovascular imaging.  Symptoms seem more suggestive of either functional neurological disorder versus atypical migraine.  Recommend symptomatic treatment of headache and chest pain I spoke to ER MD.  If symptoms do not fully resolve may consider checking MRI.  Long discussion with patient and answered questions.  I also spoke to the patient's husband over the phone and updated him on her condition. Discussed with Dr. Jeannie Milo ER MD. Greater than 50% time during this 55-minute consultation visit was spent on counseling and coordination of care about her strokelike presentation and discussion about evaluation and treatment and answered questions. Ardella Beaver, MD Medical Director [facility] Pager: [phone] 05/21/2024 5:48 PM

## 2024-05-21 NOTE — ED Triage Notes (Signed)
 Pt BIB [facility] from work due to [employer] 1515.  Pt was texting husband then called and was having a hard time speaking.  Left sided weakness with aphasia.   18g right forearm. 20g left hand. VS BP 214/72, SpO2 97%RA

## 2024-05-21 NOTE — ED Provider Notes (Signed)
 [facility] EMERGENCY DEPARTMENT AT [facility] Provider Note  CSN: [id number] Arrival date & time: 05/21/24 1638  Chief Complaint(s) Code Stroke  HPI Shirley Cameron is a 53 y.o. female with PMH HTN, anxiety, TIA, previous strokelike symptoms with TNK but negative MRI, B12 deficiency, obesity, gastric bypass who presents emergency room for evaluation of sudden onset chest pain, headache, generalized weakness and slurred speech.  Arrives as a stroke alert given expressive aphasia.   Past Medical History Past Medical History:  Diagnosis Date   Anemia    Anxiety    Arthritis    Asthma    Cancer (HCC)    oral and cervical   Complication of anesthesia    very slow/ hard to wake up and usually has itching   Depression    Elevated liver enzymes    GERD (gastroesophageal reflux disease)    Headache    Hyperlipidemia    Hypertension    Pneumonia    PTSD (post-traumatic stress disorder)    Stroke (HCC) 03/18/2022   Patient Active Problem List   Diagnosis Date Noted   Hot flashes, menopausal 03/23/2024   Transaminitis 02/21/2024   Autoimmune disease (HCC) 06/02/2023   Vitamin B12 deficiency due to intestinal malabsorption 06/02/2023   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   Irritable bowel syndrome with diarrhea 05/27/2023   History of vitamin D  deficiency 05/27/2023   Class 2 severe obesity with serious comorbidity and body mass index (BMI) of 35.0 to 35.9 in adult (HCC) 05/27/2023   History of CVA (cerebrovascular accident) 03/16/2022   Abnormal mammogram of left breast 06/01/2018   Sinus tachycardia 10/12/2017   Chronic nausea 01/28/2017   Insomnia 11/30/2016   Bipolar disorder (HCC) 10/23/2016   Attention-deficit hyperactivity disorder, 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   Anxiety 09/24/2016   Chronic headaches 09/24/2016   GERD (gastroesophageal reflux disease) 09/24/2016   PTSD (post-traumatic stress disorder) 09/24/2016   Hypertension 07/27/2016   Mild intermittent  asthma without complication 07/27/2016   Patellofemoral dysfunction of right knee 03/16/2016   Primary osteoarthritis of right knee 02/06/2016   Iron deficiency anemia 11/04/2014   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 Authorizing Provider  cloNIDine  (CATAPRES ) 0.1 MG tablet TAKE 1 TABLET BY MOUTH 2 TIMES DAILY. 05/18/24   Leopoldo Rancher, PA  clopidogrel  (PLAVIX ) 75 MG tablet Take 1 tablet (75 mg total) by mouth daily. 02/20/24   Leopoldo Rancher, PA  dicyclomine  (BENTYL ) 20 MG tablet Take 1 tablet (20 mg total) by mouth every 6 (six) hours as needed for spasms (IBS). 05/26/23   Leopoldo Rancher, PA  hydrOXYzine  (ATARAX ) 50 MG tablet TAKE 1 TABLET (50 MG TOTAL) BY MOUTH 3 (THREE) TIMES DAILY AS NEEDED FOR ITCHING OR ANXIETY. 11/22/23   Leopoldo Rancher, PA  lisinopril -hydrochlorothiazide  (ZESTORETIC ) 20-12.5 MG tablet Take 1 tablet by mouth daily. 02/20/24   Leopoldo Rancher, PA  ondansetron  (ZOFRAN ) 8 MG tablet Take 1 tablet (8 mg total) by mouth every 8 (eight) hours as needed for nausea or vomiting. 05/26/23   Leopoldo Rancher, PA  pravastatin  (PRAVACHOL ) 40 MG tablet Take 1 tablet (40 mg total) by mouth daily at 6 PM. 03/17/22   Imogene Mana, NP  promethazine  (PHENERGAN ) 25 MG tablet Take 1 tablet (25 mg total) by mouth every 6 (six) hours as needed for nausea or vomiting. 11/29/23   Leopoldo Rancher, PA  propranolol  (INDERAL ) 40  MG tablet Take 1 tablet (40 mg total) by mouth 2 (two) times daily as needed (for tachycardia). 02/20/24   Leopoldo Rancher, PA  temazepam  (RESTORIL ) 15 MG capsule TAKE 1-2 CAPSULES BY MOUTH 2 (TWO) TIMES DAILY AS NEEDED FOR SLEEP (MAX 45 MG PER DAY). MAY USE ONE CAPSULE (15 MG) FOR PANIC SYMPTOMS DURING THE DAY BUT MAY CAUSE SEDATION. 04/16/24   Leopoldo Rancher, PA  ZEPBOUND  5 MG/0.5ML Pen Inject 5 mg into the skin once a week. 04/16/24   Leopoldo Rancher, PA                                                                                                                                     Past Surgical History Past Surgical History:  Procedure Laterality Date   ABDOMINAL HYSTERECTOMY     ARTHROSCOPIC REPAIR ACL     BREAST SURGERY     CHOLECYSTECTOMY     CHONDROPLASTY Right 10/18/2019   Procedure: CHONDROPLASTY;  Surgeon: Micheline Ahr, MD;  Location: [facility];  Service: Orthopedics;  Laterality: Right;   EXPLORATORY LAPAROTOMY     FRACTURE SURGERY     ankle   GASTRIC ROUX-EN-Y  12/19/2008   HARDWARE REMOVAL     KNEE ARTHROSCOPY WITH LATERAL MENISECTOMY Right 10/18/2019   Procedure: KNEE ARTHROSCOPY WITH LATERAL MENISECTOMY;  Surgeon: Micheline Ahr, MD;  Location: [facility];  Service: Orthopedics;  Laterality: Right;   OSTEOTOMY AND TIBIAL SHORTENING     TONSILLECTOMY     TOTAL KNEE ARTHROPLASTY Right 12/07/2021   Procedure: TOTAL KNEE ARTHROPLASTY;  Surgeon: Murleen Arms, MD;  Location: [facility];  Service: Orthopedics;  Laterality: Right;   WISDOM TOOTH EXTRACTION     Family History Family History  Problem Relation Age of Onset   Breast cancer Mother    Hypertension Mother    Hypertension Father    Bladder Cancer Father    Hypertension Sister    Breast cancer Maternal Grandmother    Hypertension Maternal Grandmother    Hypertension Maternal Grandfather    Stroke Paternal Grandmother    Hypertension Paternal Grandmother    Heart disease Paternal Grandmother    Hypertension Paternal Grandfather     Social History Social History   Tobacco Use   Smoking status: Never   Smokeless tobacco: Never  Vaping Use   Vaping status: Never Used  Substance Use Topics   Alcohol use: Yes    Comment: rare   Drug use: Never   Allergies Ibuprofen, Nsaids, Penicillins, Penicillin v potassium, Cephalexin, and Sulfamethoxazole-trimethoprim  Review of Systems Review of Systems  Cardiovascular:  Positive  for chest pain.  Neurological:  Positive for headaches.    Physical Exam Vital Signs  I have reviewed the triage vital signs BP (!) 139/108 (BP Location: Right Arm)   Pulse 91   Temp 98.8 F (37.1 C) (Oral)   Resp 17  Wt 103.6 kg   SpO2 99%   BMI 36.86 kg/m   Physical Exam Vitals and nursing note reviewed.  Constitutional:      General: She is not in acute distress.    Appearance: She is well-developed.  HENT:     Head: Normocephalic and atraumatic.  Eyes:     Conjunctiva/sclera: Conjunctivae normal.  Cardiovascular:     Rate and Rhythm: Normal rate and regular rhythm.     Heart sounds: No murmur heard. Pulmonary:     Effort: Pulmonary effort is normal. No respiratory distress.     Breath sounds: Normal breath sounds.  Abdominal:     Palpations: Abdomen is soft.     Tenderness: There is no abdominal tenderness.  Musculoskeletal:        General: No swelling.     Cervical back: Neck supple.  Skin:    General: Skin is warm and dry.     Capillary Refill: Capillary refill takes less than 2 seconds.  Neurological:     Mental Status: She is alert.  Psychiatric:        Mood and Affect: Mood normal.     ED Results and Treatments Labs (all labs ordered are listed, but only abnormal results are displayed) Labs Reviewed  I-STAT CHEM 8, ED - Abnormal; Notable for the following components:      Result Value   Potassium 6.4 (*)    Glucose, Bld 105 (*)    Calcium, Ion 0.95 (*)    All other components within normal limits  CBG MONITORING, ED - Abnormal; Notable for the following components:   Glucose-Capillary 102 (*)    All other components within normal limits  CBC  DIFFERENTIAL  ETHANOL  PROTIME-INR  APTT  COMPREHENSIVE METABOLIC PANEL WITH GFR  TROPONIN I (HIGH SENSITIVITY)                                                                                                                          Radiology CT ANGIO HEAD NECK W WO CM (CODE STROKE) Result Date:  05/21/2024 CLINICAL DATA:  Neuro deficit, concern for stroke, left-sided weakness and facial droop, aphasia. EXAM: CT ANGIOGRAPHY HEAD AND NECK WITH AND WITHOUT CONTRAST TECHNIQUE: Multidetector CT imaging of the head and neck was performed using the standard protocol during bolus administration of intravenous contrast. Multiplanar CT image reconstructions and MIPs were obtained to evaluate the vascular anatomy. Carotid stenosis measurements (when applicable) are obtained utilizing NASCET criteria, using the distal internal carotid diameter as the denominator. RADIATION DOSE REDUCTION: This exam was performed according to the departmental dose-optimization program which includes automated exposure control, adjustment of the mA and/or kV according to patient size and/or use of iterative reconstruction technique. CONTRAST:  75mL OMNIPAQUE  IOHEXOL  350 MG/ML SOLN COMPARISON:  Same-day head CT.  CTA head and neck 03/16/2022. FINDINGS: CTA NECK FINDINGS Aortic arch: Standard configuration of the aortic arch. Imaged portion shows no evidence of aneurysm or dissection. No significant stenosis of  the major arch vessel origins. Pulmonary arteries: As permitted by contrast timing, there are no filling defects in the visualized pulmonary arteries. Subclavian arteries: The subclavian arteries are patent bilaterally. Right carotid system: No evidence of dissection, stenosis (50% or greater), or occlusion. Minimal atherosclerosis at the carotid bifurcation. Mild tortuosity of the mid cervical ICA. Left carotid system: No evidence of dissection, stenosis (50% or greater), or occlusion. Vertebral arteries: Codominant. No evidence of dissection, stenosis (50% or greater), or occlusion. Skeleton: No acute findings. Degenerative changes in the cervical spine. Mild disc space narrowing at C5-6 and C6-7. Edentulous maxilla and mandible. Other neck: The visualized airway is patent. No cervical lymphadenopathy. Upper chest: Visualized lung  apices are clear. Review of the MIP images confirms the above findings CTA HEAD FINDINGS ANTERIOR CIRCULATION: The intracranial ICAs are patent bilaterally. No significant stenosis, proximal occlusion, aneurysm, or vascular malformation. MCAs: The middle cerebral arteries are patent bilaterally. ACAs: The anterior cerebral arteries are patent bilaterally. POSTERIOR CIRCULATION: No significant stenosis, proximal occlusion, aneurysm, or vascular malformation. PCAs: Patent bilaterally. Fetal origin of the right PCA. The left PCA is primarily supplied by the posterior communicating artery with a small left P1 segment noted. Pcomm: Visualized bilaterally. SCAs: The superior cerebellar arteries are patent bilaterally. Basilar artery: Patent. Significant tapering of the basilar artery after the superior cerebellar artery origins. AICAs: Visualized on the right. PICAs: Visualized on the left. Vertebral arteries: The intracranial vertebral arteries are patent. Venous sinuses: As permitted by contrast timing, patent. Anatomic variants: None Review of the MIP images confirms the above findings IMPRESSION: No large vessel occlusion. No high-grade stenosis, aneurysm, or dissection of the arteries in the head and neck. Electronically Signed   By: Denny Flack M.D.   On: 05/21/2024 17:33   CT HEAD CODE STROKE WO CONTRAST Result Date: 05/21/2024 CLINICAL DATA:  Code stroke. Neuro deficit, concern for stroke, left-sided weakness, facial droop and aphasia. EXAM: CT HEAD WITHOUT CONTRAST TECHNIQUE: Contiguous axial images were obtained from the base of the skull through the vertex without intravenous contrast. RADIATION DOSE REDUCTION: This exam was performed according to the departmental dose-optimization program which includes automated exposure control, adjustment of the mA and/or kV according to patient size and/or use of iterative reconstruction technique. COMPARISON:  CT head 03/16/2022. FINDINGS: Brain: No acute intracranial  hemorrhage. No CT evidence of acute infarct. Similar focal hypoattenuation in the left frontal lobe white matter. No edema, mass effect, or midline shift. The basilar cisterns are patent. Ventricles: The ventricles are normal. Vascular: Atherosclerotic calcifications of the carotid siphons and intracranial vertebral arteries. No hyperdense vessel. Skull: No acute or aggressive finding. Orbits: Orbits are symmetric. Sinuses: The visualized paranasal sinuses are clear. Other: Mastoid air cells are clear. ASPECTS [facility] Stroke Program Early CT Score) - Ganglionic level infarction (caudate, lentiform nuclei, internal capsule, insula, M1-M3 cortex): 7 - Supraganglionic infarction (M4-M6 cortex): 3 Total score (0-10 with 10 being normal): 10 IMPRESSION: 1. No CT evidence of acute intracranial abnormality. 2. ASPECTS is 10 These results were communicated to Dr. Janett Medin at 5:00 pm on 05/21/2024 by text page via the [facility] messaging system. Electronically Signed   By: Denny Flack M.D.   On: 05/21/2024 17:00    Pertinent labs & imaging results that were available during my care of the patient were reviewed by me and considered in my medical decision making (see MDM for details).  Medications Ordered in ED Medications  sodium chloride  flush (NS) 0.9 % injection 3 mL (3 mLs Intravenous  Given 05/21/24 1712)  iohexol  (OMNIPAQUE ) 350 MG/ML injection 75 mL (75 mLs Intravenous Contrast Given 05/21/24 1659)  prochlorperazine (COMPAZINE) injection 10 mg (10 mg Intravenous Given 05/21/24 1722)  diphenhydrAMINE  (BENADRYL ) injection 25 mg (25 mg Intravenous Given 05/21/24 1722)  lactated ringers  bolus 1,000 mL (0 mLs Intravenous Stopped 05/21/24 1829)                                                                                                                                     Procedures .Critical Care  Performed by: Karlyn Overman, MD Authorized by: Karlyn Overman, MD   Critical care provider statement:    Critical care  time (minutes):  30   Critical care was necessary to treat or prevent imminent or life-threatening deterioration of the following conditions:  CNS failure or compromise   Critical care was time spent personally by me on the following activities:  Development of treatment plan with patient or surrogate, discussions with consultants, evaluation of patient's response to treatment, examination of patient, ordering and review of laboratory studies, ordering and review of radiographic studies, ordering and performing treatments and interventions, pulse oximetry, re-evaluation of patient's condition and review of old charts   (including critical care time)  Medical Decision Making / ED Course   This patient presents to the ED for concern of headache, chest pain, aphasia, this involves an extensive number of treatment options, and is a complaint that carries with it a high risk of complications and morbidity.  The differential diagnosis includes CVA, hemorrhagic stroke, mass, Todd's paralysis, seizure, electrolyte abnormality, encephalopathy, complicated migraine  MDM: Patient seen emergency room for evaluation of headache, chest pain and aphasia.  She arrives as a stroke alert and patient was taken immediately to the CT scanner.  I consulted the stroke neurologist Dr. Janett Medin who evaluated the patient and initial neurologic exam appears to be more functional in nature, nonfocal.  Initial stroke imaging reassuringly negative for stroke.  Patient given headache cocktail for symptomatic management and concern for complicated migraine.  Laboratory evaluation largely unremarkable outside of a potassium of 6.4.  This was on  an i-STAT Chem-8 and follow-up CMP from the same blood draw had significant hemolysis.  Do suspect that this may be a hemolyzed value and repeat CMP was ordered.  I was then called to the room as the patient felt symptomatically improved, ripped out her IVs and was demanding to leave the emergency  department.  I did have a discussion about her potassium and that it is important to have a repeat potassium drawn to ensure that she does not have a life-threatening hyperkalemia.  Patient refused any additional laboratory testing and is requesting to leave AGAINST MEDICAL ADVICE.  Patient then discharged AGAINST MEDICAL ADVICE as she is of sound mind and able to make her own medical decisions.  She voiced understanding of the risks and benefits of leaving which include possible cardiac arrhythmia  from life-threatening electrolyte abnormality, significant morbidity or death.  I strongly encouraged her to return to the emergency department immediately to have confirmatory potassium testing.   Additional history obtained: -Additional history obtained from husband -External records from outside source obtained and reviewed including: Chart review including previous notes, labs, imaging, consultation notes   Lab Tests: -I ordered, reviewed, and interpreted labs.   The pertinent results include:   Labs Reviewed  I-STAT CHEM 8, ED - Abnormal; Notable for the following components:      Result Value   Potassium 6.4 (*)    Glucose, Bld 105 (*)    Calcium, Ion 0.95 (*)    All other components within normal limits  CBG MONITORING, ED - Abnormal; Notable for the following components:   Glucose-Capillary 102 (*)    All other components within normal limits  CBC  DIFFERENTIAL  ETHANOL  PROTIME-INR  APTT  COMPREHENSIVE METABOLIC PANEL WITH GFR  TROPONIN I (HIGH SENSITIVITY)      EKG   EKG Interpretation Date/Time:  Monday May 21 2024 17:11:06 EDT Ventricular Rate:  90 PR Interval:  155 QRS Duration:  95 QT Interval:  394 QTC Calculation: 483 R Axis:   22  Text Interpretation: Sinus rhythm Confirmed by Kendricks Reap ([id number]) on 05/21/2024 10:32:23 PM         Imaging Studies ordered: I ordered imaging studies including CT head, CT angio I independently visualized and interpreted  imaging. I agree with the radiologist interpretation   Medicines ordered and prescription drug management: Meds ordered this encounter  Medications   sodium chloride  flush (NS) 0.9 % injection 3 mL   iohexol  (OMNIPAQUE ) 350 MG/ML injection 75 mL   prochlorperazine (COMPAZINE) injection 10 mg   diphenhydrAMINE  (BENADRYL ) injection 25 mg   lactated ringers  bolus 1,000 mL    -I have reviewed the patients home medicines and have made adjustments as needed  Critical interventions Stroke alert and consideration of TNK  Consultations Obtained: I requested consultation with the stroke neurologist,  and discussed lab and imaging findings as well as pertinent plan - they recommend: Migraine cocktail, reevaluation   Cardiac Monitoring: The patient was maintained on a cardiac monitor.  I personally viewed and interpreted the cardiac monitored which showed an underlying rhythm of: NSR  Social Determinants of Health:  Factors impacting patients care include: none   Reevaluation: After the interventions noted above, I reevaluated the patient and found that they have :improved  Co morbidities that complicate the patient evaluation  Past Medical History:  Diagnosis Date   Anemia    Anxiety    Arthritis    Asthma    Cancer (HCC)    oral and cervical   Complication of anesthesia    very slow/ hard to wake up and usually has itching   Depression    Elevated liver enzymes    GERD (gastroesophageal reflux disease)    Headache    Hyperlipidemia    Hypertension    Pneumonia    PTSD (post-traumatic stress disorder)    Stroke (HCC) 03/18/2022      Dispostion: I considered admission for this patient, and patient decided to leave the emergency department AGAINST MEDICAL ADVICE.     Final Clinical Impression(s) / ED Diagnoses Final diagnoses:  Acute nonintractable headache, unspecified headache type     @PCDICTATION @    Karlyn Overman, MD 05/21/24 2233

## 2024-05-21 NOTE — Code Documentation (Signed)
 Stroke Response Nurse Documentation Code Documentation  Shirley Cameron is a 53 y.o. female arriving to [facility]  via [facility] on 6/2 with past medical hx of hypertension, anxiety, TIA, strokelike episode status post TNK with negative MRI, IBS, vitamin B12 deficiency, obesity, bipolar disorder, ADHD, GERD, arthritis and gastric bypass. On clopidogrel  75 mg daily. Code stroke was activated by EMS.   Patient from home where she was LKW at 1515 when she was talking on the phone with her husband and had a sudden onset of difficulty speaking and generalized weakness along with chest pain.    Stroke team at the bedside on patient arrival. Labs drawn and patient cleared for CT by Dr. Jeannie Milo. Patient to CT with team. NIHSS 8, see documentation for details and code stroke times. Patient with disoriented, bilateral arm weakness, bilateral leg weakness, and dysarthria  on exam. The following imaging was completed:  CT Head and CTA. Patient is not a candidate for IV Thrombolytic due to stroke not suspected. Patient is not a candidate for IR due to no LVO.   Care Plan: q2 NIHSS and vitals (per Dr. Janett Medin).   Bedside handoff with ED RN [place].    Marlon Simpson  Stroke Response RN

## 2024-05-22 ENCOUNTER — Ambulatory Visit (INDEPENDENT_AMBULATORY_CARE_PROVIDER_SITE_OTHER): Payer: Self-pay | Admitting: Family Medicine

## 2024-05-22 ENCOUNTER — Encounter: Payer: Self-pay | Admitting: Family Medicine

## 2024-05-22 VITALS — BP 124/86 | HR 80 | Ht 66.0 in | Wt 225.0 lb

## 2024-05-22 DIAGNOSIS — Z09 Encounter for follow-up examination after completed treatment for conditions other than malignant neoplasm: Secondary | ICD-10-CM

## 2024-05-22 DIAGNOSIS — E875 Hyperkalemia: Secondary | ICD-10-CM

## 2024-05-22 DIAGNOSIS — F419 Anxiety disorder, unspecified: Secondary | ICD-10-CM

## 2024-05-22 NOTE — Progress Notes (Signed)
   Established Patient Office Visit  Subjective   Patient ID: Shirley Cameron, female    DOB: Oct 26, 1971  Age: 53 y.o. MRN: [id number]  Chief Complaint  Patient presents with   Hospitalization Follow-up    Headache- Went to [facility] on 05/23/2024. Pt needs a return to work note. Patient is feeling much better today. No pain, no headache. Patient was concerned she was having a stroke due to history, but found out it was just a headache.     ED follow up.   ED notes reviewed.   Normal imaging, Labs : Hyperkalemia which per notes was likely 2/2 hemolysis, which I feel the same.   Reports overall she is doing ok , her anxiety was worse while she was in the ER but improved later at home.  She is requesting return to work letter.       Review of Systems  All other systems reviewed and are negative.     Objective:     BP 124/86   Pulse 80   Ht 5\' 6"  (1.676 m)   Wt 225 lb (102.1 kg)   SpO2 100%   BMI 36.32 kg/m    Physical Exam Vitals and nursing note reviewed.  Constitutional:      Appearance: Normal appearance.  HENT:     Head: Normocephalic.     Right Ear: External ear normal.     Left Ear: External ear normal.  Eyes:     Conjunctiva/sclera: Conjunctivae normal.  Cardiovascular:     Rate and Rhythm: Normal rate.  Pulmonary:     Effort: Pulmonary effort is normal. No respiratory distress.  Abdominal:     Palpations: Abdomen is soft.  Musculoskeletal:        General: Normal range of motion.  Skin:    General: Skin is warm.  Neurological:     Mental Status: She is alert and oriented to person, place, and time.  Psychiatric:        Mood and Affect: Mood normal.      No results found for any visits on 05/22/24.    The ASCVD Risk score (Arnett DK, et al., 2019) failed to calculate for the following reasons:   Risk score cannot be calculated because patient has a medical history suggesting prior/existing ASCVD    Assessment & Plan:   Problem List  Items Addressed This Visit       Other   Anxiety   Other Visit Diagnoses       Hospital discharge follow-up    -  Primary   Relevant Orders   Comprehensive metabolic panel with GFR     Hyperkalemia       Relevant Orders   Comprehensive metabolic panel with GFR     Recommend hydration, Continue current medication. Follow up with PCP for Anxiety.  Recommend CMP to recheck K levels. Return if symptoms worsen or fail to improve.    Vinary K Erroll Wilbourne, MD

## 2024-06-12 ENCOUNTER — Ambulatory Visit (HOSPITAL_BASED_OUTPATIENT_CLINIC_OR_DEPARTMENT_OTHER)
Admission: RE | Admit: 2024-06-12 | Discharge: 2024-06-12 | Disposition: A | Discharge status: Home / Self Care | Source: Ambulatory Visit | Attending: Family Medicine | Admitting: Family Medicine

## 2024-06-12 ENCOUNTER — Ambulatory Visit (HOSPITAL_BASED_OUTPATIENT_CLINIC_OR_DEPARTMENT_OTHER): Payer: Self-pay | Admitting: Family Medicine

## 2024-06-12 ENCOUNTER — Other Ambulatory Visit: Payer: Self-pay | Admitting: Physician Assistant

## 2024-06-12 ENCOUNTER — Encounter (HOSPITAL_BASED_OUTPATIENT_CLINIC_OR_DEPARTMENT_OTHER): Payer: Self-pay

## 2024-06-12 ENCOUNTER — Ambulatory Visit (HOSPITAL_BASED_OUTPATIENT_CLINIC_OR_DEPARTMENT_OTHER): Admitting: Radiology

## 2024-06-12 VITALS — BP 111/83 | HR 90 | Temp 98.9°F | Resp 18

## 2024-06-12 DIAGNOSIS — R1011 Right upper quadrant pain: Secondary | ICD-10-CM

## 2024-06-12 DIAGNOSIS — K5900 Constipation, unspecified: Secondary | ICD-10-CM

## 2024-06-12 DIAGNOSIS — R1013 Epigastric pain: Secondary | ICD-10-CM | POA: Diagnosis not present

## 2024-06-12 DIAGNOSIS — I10 Essential (primary) hypertension: Secondary | ICD-10-CM

## 2024-06-12 DIAGNOSIS — G8929 Other chronic pain: Secondary | ICD-10-CM

## 2024-06-12 DIAGNOSIS — R109 Unspecified abdominal pain: Secondary | ICD-10-CM | POA: Diagnosis not present

## 2024-06-12 DIAGNOSIS — Z9884 Bariatric surgery status: Secondary | ICD-10-CM | POA: Diagnosis not present

## 2024-06-12 DIAGNOSIS — R1031 Right lower quadrant pain: Secondary | ICD-10-CM | POA: Diagnosis not present

## 2024-06-12 DIAGNOSIS — Z9049 Acquired absence of other specified parts of digestive tract: Secondary | ICD-10-CM | POA: Diagnosis not present

## 2024-06-12 DIAGNOSIS — N951 Menopausal and female climacteric states: Secondary | ICD-10-CM

## 2024-06-12 MED ORDER — ONDANSETRON HCL 4 MG/2ML IJ SOLN
4.0000 mg | Freq: Once | INTRAMUSCULAR | Status: AC
Start: 2024-06-12 — End: 2024-06-12
  Administered 2024-06-12: 4 mg via INTRAMUSCULAR

## 2024-06-12 NOTE — ED Provider Notes (Signed)
 [facility]    CSN: [id number] Arrival date & time: 06/12/24  1035      History   Chief Complaint Chief Complaint  Patient presents with   Abdominal Pain    Entered by patient    HPI Shirley Cameron is a 53 y.o. female.   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  She reports she lost down from 315 pounds to 155 pounds.  Over the last 14 years she has regained quite a bit of her weight.  She has had significant vitamin deficiencies.  She has to get iron infusions, B12 shots and she has lost most of her teeth due to calcium deficiency.  She has trouble vomiting but she often has dry heaves if she is sick.  At midnight on 06/11/2024 or into the morning of 06/12/2024, she woke up feeling very nauseous and was gagging.  She took ondansetron  4 mg at 3:15 AM on 06/12/2024.  When she woke up around midnight, she had sharp stabbing pain and the nausea.  She had dry heaves and felt like she might need to have a bowel movement.  She sat on the toilet and strained for a bowel movement some small amounts of hard stool came out.  As the night progressed she had a little bit more hard stool and then everything after that has been liquid stool.  She still having nausea and feeling very sick.  She has had chronic GI problems since her gastric bypass and has seen [facility] in the next 4 weeks.   Abdominal Pain Associated symptoms: constipation, diarrhea and nausea   Associated symptoms: no chest pain, no chills, no cough, no dysuria, no fever, no hematuria, no shortness of breath, no sore throat and no vomiting     Past Medical History:  Diagnosis Date   Anemia    Anxiety    Arthritis    Asthma    Cancer (HCC)    oral and cervical   Complication of anesthesia    very slow/ hard to wake up and usually has itching   Depression    Elevated liver enzymes    GERD (gastroesophageal reflux disease)    Headache    Hyperlipidemia    Hypertension     Pneumonia    PTSD (post-traumatic stress disorder)    Stroke (HCC) 03/18/2022    Patient Active Problem List   Diagnosis Date Noted   Hot flashes, menopausal 03/23/2024   Transaminitis 02/21/2024   Autoimmune disease (HCC) 06/02/2023   Vitamin B12 deficiency due to intestinal malabsorption 06/02/2023   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   Irritable bowel syndrome with diarrhea 05/27/2023   History of vitamin D  deficiency 05/27/2023   Class 2 severe obesity with serious comorbidity and body mass index (BMI) of 35.0 to 35.9 in adult (HCC) 05/27/2023   History of CVA (cerebrovascular accident) 03/16/2022   Abnormal mammogram of left breast 06/01/2018   Sinus tachycardia 10/12/2017   Chronic nausea 01/28/2017   Insomnia 11/30/2016   Bipolar disorder (HCC) 10/23/2016   Attention-deficit hyperactivity disorder, 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   Anxiety 09/24/2016   Chronic headaches 09/24/2016   GERD (gastroesophageal reflux disease) 09/24/2016   PTSD (post-traumatic stress disorder) 09/24/2016   Hypertension 07/27/2016   Mild intermittent asthma without complication 07/27/2016   Patellofemoral dysfunction of right knee 03/16/2016   Primary osteoarthritis of right knee 02/06/2016   Iron deficiency anemia 11/04/2014   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    Past Surgical  History:  Procedure Laterality Date   ABDOMINAL HYSTERECTOMY     ARTHROSCOPIC REPAIR ACL     BREAST SURGERY     CHOLECYSTECTOMY     CHONDROPLASTY Right 10/18/2019   Procedure: CHONDROPLASTY;  Surgeon: Cristy Bonner DASEN, MD;  Location: [facility];  Service: Orthopedics;  Laterality: Right;   EXPLORATORY LAPAROTOMY     FRACTURE SURGERY     ankle   GASTRIC ROUX-EN-Y  12/19/2008   HARDWARE REMOVAL     KNEE ARTHROSCOPY WITH LATERAL MENISECTOMY Right 10/18/2019   Procedure: KNEE ARTHROSCOPY WITH LATERAL MENISECTOMY;  Surgeon: Cristy Bonner DASEN, MD;  Location: [facility];  Service:  Orthopedics;  Laterality: Right;   OSTEOTOMY AND TIBIAL SHORTENING     TONSILLECTOMY     TOTAL KNEE ARTHROPLASTY Right 12/07/2021   Procedure: TOTAL KNEE ARTHROPLASTY;  Surgeon: Edna Toribio LABOR, MD;  Location: [facility];  Service: Orthopedics;  Laterality: Right;   WISDOM TOOTH EXTRACTION      OB History   No obstetric history on file.      Home Medications    Prior to Admission medications   Medication Sig Start Date End Date Taking? Authorizing Provider  cloNIDine  (CATAPRES ) 0.1 MG tablet TAKE 1 TABLET BY MOUTH 2 TIMES DAILY. 05/18/24  Yes Manya Toribio SQUIBB, PA  clopidogrel  (PLAVIX ) 75 MG tablet Take 1 tablet (75 mg total) by mouth daily. 02/20/24  Yes Manya Toribio SQUIBB, PA  lisinopril -hydrochlorothiazide  (ZESTORETIC ) 20-12.5 MG tablet Take 1 tablet by mouth daily. 02/20/24  Yes Manya Toribio SQUIBB, PA  ondansetron  (ZOFRAN ) 8 MG tablet Take 1 tablet (8 mg total) by mouth every 8 (eight) hours as needed for nausea or vomiting. 05/26/23  Yes Manya Toribio SQUIBB, PA  promethazine  (PHENERGAN ) 25 MG tablet Take 1 tablet (25 mg total) by mouth every 6 (six) hours as needed for nausea or vomiting. 11/29/23  Yes Manya Toribio SQUIBB, PA  propranolol  (INDERAL ) 40 MG tablet Take 1 tablet (40 mg total) by mouth 2 (two) times daily as needed (for tachycardia). 02/20/24  Yes Manya Toribio SQUIBB, PA  dicyclomine  (BENTYL ) 20 MG tablet Take 1 tablet (20 mg total) by mouth every 6 (six) hours as needed for spasms (IBS). 05/26/23   Manya Toribio SQUIBB, PA  hydrOXYzine  (ATARAX ) 50 MG tablet TAKE 1 TABLET (50 MG TOTAL) BY MOUTH 3 (THREE) TIMES DAILY AS NEEDED FOR ITCHING OR ANXIETY. 11/22/23   Manya Toribio SQUIBB, PA  pravastatin  (PRAVACHOL ) 40 MG tablet Take 1 tablet (40 mg total) by mouth daily at 6 PM. 03/17/22   Remi Pippin, NP  temazepam  (RESTORIL ) 15 MG capsule TAKE 1-2 CAPSULES BY MOUTH 2 (TWO) TIMES DAILY AS NEEDED FOR SLEEP (MAX 45 MG PER DAY). MAY USE ONE CAPSULE (15 MG) FOR PANIC SYMPTOMS DURING THE DAY BUT MAY CAUSE  SEDATION. 04/16/24   Manya Toribio SQUIBB, PA  ZEPBOUND  5 MG/0.5ML Pen Inject 5 mg into the skin once a week. 04/16/24   Manya Toribio SQUIBB, PA    Family History Family History  Problem Relation Age of Onset   Breast cancer Mother    Hypertension Mother    Hypertension Father    Bladder Cancer Father    Hypertension Sister    Breast cancer Maternal Grandmother    Hypertension Maternal Grandmother    Hypertension Maternal Grandfather    Stroke Paternal Grandmother    Hypertension Paternal Grandmother    Heart disease Paternal Grandmother    Hypertension Paternal Grandfather     Social  History Social History   Tobacco Use   Smoking status: Never   Smokeless tobacco: Never  Vaping Use   Vaping status: Never Used  Substance Use Topics   Alcohol use: Yes    Comment: rare   Drug use: Never     Allergies   Ibuprofen, Nsaids, Penicillins, Penicillin v potassium, Cephalexin, and Sulfamethoxazole-trimethoprim   Review of Systems Review of Systems  Constitutional:  Negative for chills and fever.  HENT:  Negative for ear pain and sore throat.   Eyes:  Negative for pain and visual disturbance.  Respiratory:  Negative for cough and shortness of breath.   Cardiovascular:  Negative for chest pain and palpitations.  Gastrointestinal:  Positive for abdominal pain, constipation, diarrhea and nausea. Negative for vomiting.  Genitourinary:  Negative for dysuria and hematuria.  Musculoskeletal:  Negative for arthralgias and back pain.  Skin:  Negative for color change and rash.  Neurological:  Negative for seizures and syncope.  All other systems reviewed and are negative.    Physical Exam Triage Vital Signs ED Triage Vitals  Encounter Vitals Group     BP 06/12/24 1046 111/83     Girls Systolic BP Percentile --      Girls Diastolic BP Percentile --      Boys Systolic BP Percentile --      Boys Diastolic BP Percentile --      Pulse Rate 06/12/24 1046 90     Resp 06/12/24 1046 18      Temp 06/12/24 1046 98.9 F (37.2 C)     Temp Source 06/12/24 1046 Oral     SpO2 06/12/24 1046 98 %     Weight --      Height --      Head Circumference --      Peak Flow --      Pain Score 06/12/24 1045 6     Pain Loc --      Pain Education --      Exclude from Growth Chart --    No data found.  Updated Vital Signs BP 111/83 (BP Location: Right Arm)   Pulse 90   Temp 98.9 F (37.2 C) (Oral)   Resp 18   SpO2 98%   Visual Acuity Right Eye Distance:   Left Eye Distance:   Bilateral Distance:    Right Eye Near:   Left Eye Near:    Bilateral Near:     Physical Exam Vitals and nursing note reviewed.  Constitutional:      General: She is not in acute distress.    Appearance: She is well-developed. She is not ill-appearing or toxic-appearing.  HENT:     Head: Normocephalic and atraumatic.     Right Ear: Hearing, tympanic membrane, ear canal and external ear normal.     Left Ear: Hearing, tympanic membrane, ear canal and external ear normal.     Nose: No congestion or rhinorrhea.     Right Sinus: No maxillary sinus tenderness or frontal sinus tenderness.     Left Sinus: No maxillary sinus tenderness or frontal sinus tenderness.     Mouth/Throat:     Lips: Pink.     Mouth: Mucous membranes are moist.     Pharynx: Uvula midline. No oropharyngeal exudate or posterior oropharyngeal erythema.     Tonsils: No tonsillar exudate.   Eyes:     Conjunctiva/sclera: Conjunctivae normal.     Pupils: Pupils are equal, round, and reactive to light.    Cardiovascular:  Rate and Rhythm: Normal rate and regular rhythm.     Heart sounds: S1 normal and S2 normal. No murmur heard. Pulmonary:     Effort: Pulmonary effort is normal. No respiratory distress.     Breath sounds: Normal breath sounds. No decreased breath sounds, wheezing, rhonchi or rales.  Abdominal:     General: Bowel sounds are normal.     Palpations: Abdomen is soft.     Tenderness: There is abdominal  tenderness (Moderate to severe abdominal pain with guarding) in the right upper quadrant, right lower quadrant and epigastric area. There is guarding. There is no right CVA tenderness, left CVA tenderness or rebound. Negative signs include Murphy's sign, Rovsing's sign and McBurney's sign.   Musculoskeletal:        General: No swelling.     Cervical back: Neck supple.  Lymphadenopathy:     Head:     Right side of head: No submental, submandibular, tonsillar, preauricular or posterior auricular adenopathy.     Left side of head: No submental, submandibular, tonsillar, preauricular or posterior auricular adenopathy.     Cervical: No cervical adenopathy.     Right cervical: No superficial cervical adenopathy.    Left cervical: No superficial cervical adenopathy.   Skin:    General: Skin is warm and dry.     Capillary Refill: Capillary refill takes less than 2 seconds.     Findings: No rash.   Neurological:     Mental Status: She is alert and oriented to person, place, and time.   Psychiatric:        Mood and Affect: Mood normal.      UC Treatments / Results  Labs (all labs ordered are listed, but only abnormal results are displayed) Labs Reviewed - No data to display  EKG   Radiology No results found.  Procedures Procedures (including critical care time)  Medications Ordered in UC Medications  ondansetron  (ZOFRAN ) injection 4 mg (4 mg Intramuscular Given 06/12/24 1119)    Initial Impression / Assessment and Plan / UC Course  I have reviewed the triage vital signs and the nursing notes.  Pertinent labs & imaging results that were available during my care of the patient were reviewed by me and considered in my medical decision making (see chart for details).  Plan of Care: Abdominal pain and constipation: Abdominal x-ray shows a large stool burden.  Educated about abdominal pain, gas and constipation.  Encouraged to get empty of stool.  Work excuse provided.  Follow-up  if symptoms do not improve, worsen or new symptoms occur.  I reviewed the plan of care with the patient and/or the patient's guardian.  The patient and/or guardian had time to ask questions and acknowledged that the questions were answered.  I provided instruction on symptoms or reasons to return here or to go to an ER, if symptoms/condition did not improve, worsened or if new symptoms occurred.  Final Clinical Impressions(s) / UC Diagnoses   Final diagnoses:  Right lower quadrant abdominal pain  Right upper quadrant abdominal pain  Abdominal pain, chronic, epigastric  History of Roux-en-Y gastric bypass  Constipation, unspecified constipation type     Discharge Instructions      Abdominal pain and constipation: Exam was essentially normal.  Abdominal x-ray shows a large stool burden and some abdominal gas.  Encouraged to get empty of stool with either magnesium citrate, milk of magnesia or MiraLAX (all are over-the-counter).  Educated about and discussed constipation versus diarrhea leakage around constipation.  Follow-up if symptoms do not improve, worsen or new symptoms occur.     ED Prescriptions   None    PDMP not reviewed this encounter.   Ival Domino, FNP 06/12/24 1226

## 2024-06-12 NOTE — ED Triage Notes (Signed)
 Pt c/o abdominal pain started around midnight she has had gastric bypass surgery, she did throw up last night and this morning the nausea has subsided some she did take a Zofran .

## 2024-06-12 NOTE — Progress Notes (Signed)
 Abdominal X=Ray: negative.  Patient updated via VM message.

## 2024-06-12 NOTE — Discharge Instructions (Addendum)
 Abdominal pain and constipation: Exam was essentially normal.  Abdominal x-ray shows a large stool burden and some abdominal gas.  Encouraged to get empty of stool with either magnesium citrate, milk of magnesia or MiraLAX (all are over-the-counter).  Educated about and discussed constipation versus diarrhea leakage around constipation.  Follow-up if symptoms do not improve, worsen or new symptoms occur.

## 2024-06-13 NOTE — Telephone Encounter (Signed)
 Too soon for refill, refilled 05/18/24.  Requested Prescriptions  Pending Prescriptions Disp Refills   cloNIDine  (CATAPRES ) 0.1 MG tablet [Pharmacy Med Name: CLONIDINE  HCL 0.1 MG TABLET] 180 tablet 1    Sig: TAKE 1 TABLET BY MOUTH 2 TIMES DAILY.     Cardiovascular:  Alpha-2 Agonists Passed - 06/13/2024  1:48 PM      Passed - Last BP in normal range    BP Readings from Last 1 Encounters:  06/12/24 111/83         Passed - Last Heart Rate in normal range    Pulse Readings from Last 1 Encounters:  06/12/24 90         Passed - Valid encounter within last 6 months    Recent Outpatient Visits           3 weeks ago Hospital discharge follow-up   [facility] at [facility], Vinay K, [place]   2 months ago Primary hypertension   [facility] Primary Care & Sports Medicine at [facility], Toribio SQUIBB, [place]   3 months ago Anxiety   [facility] Primary Care & Sports Medicine at [facility], Toribio SQUIBB, [place]

## 2024-07-20 ENCOUNTER — Other Ambulatory Visit: Payer: Self-pay | Admitting: Physician Assistant

## 2024-07-20 DIAGNOSIS — I10 Essential (primary) hypertension: Secondary | ICD-10-CM

## 2024-07-20 DIAGNOSIS — N951 Menopausal and female climacteric states: Secondary | ICD-10-CM

## 2024-07-20 NOTE — Telephone Encounter (Signed)
 Requested Prescriptions  Pending Prescriptions Disp Refills   cloNIDine  (CATAPRES ) 0.1 MG tablet [Pharmacy Med Name: CLONIDINE  HCL 0.1 MG TABLET] 180 tablet 0    Sig: TAKE 1 TABLET BY MOUTH 2 TIMES DAILY.     Cardiovascular:  Alpha-2 Agonists Passed - 07/20/2024  2:17 PM      Passed - Last BP in normal range    BP Readings from Last 1 Encounters:  06/12/24 111/83         Passed - Last Heart Rate in normal range    Pulse Readings from Last 1 Encounters:  06/12/24 90         Passed - Valid encounter within last 6 months    Recent Outpatient Visits           1 month ago Hospital discharge follow-up   [facility] Primary Care & Sports Medicine at [facility], Vinay K, [place]   3 months ago Primary hypertension   [facility] Primary Care & Sports Medicine at [facility], Toribio SQUIBB, [place]   5 months ago Anxiety   [facility] Primary Care & Sports Medicine at [facility], Toribio SQUIBB, [place]

## 2024-08-16 ENCOUNTER — Other Ambulatory Visit: Payer: Self-pay | Admitting: Physician Assistant

## 2024-08-16 DIAGNOSIS — R000 Tachycardia, unspecified: Secondary | ICD-10-CM

## 2024-08-16 DIAGNOSIS — Z8673 Personal history of transient ischemic attack (TIA), and cerebral infarction without residual deficits: Secondary | ICD-10-CM

## 2024-08-17 NOTE — Telephone Encounter (Signed)
 Requested Prescriptions  Pending Prescriptions Disp Refills   clopidogrel  (PLAVIX ) 75 MG tablet [Pharmacy Med Name: CLOPIDOGREL  75 MG TABLET] 90 tablet 0    Sig: TAKE 1 TABLET BY MOUTH EVERY DAY     Hematology: Antiplatelets - clopidogrel  Passed - 08/17/2024  2:12 PM      Passed - HCT in normal range and within 180 days    HCT  Date Value Ref Range Status  05/21/2024 39.0 36.0 - 46.0 % Final   Hematocrit  Date Value Ref Range Status  02/20/2024 36.7 34.0 - 46.6 % Final         Passed - HGB in normal range and within 180 days    Hemoglobin  Date Value Ref Range Status  05/21/2024 13.3 12.0 - 15.0 g/dL Final  96/96/7974 87.7 11.1 - 15.9 g/dL Final         Passed - PLT in normal range and within 180 days    Platelets  Date Value Ref Range Status  05/21/2024 335 150 - 400 K/uL Final  02/20/2024 385 150 - 450 x10E3/uL Final         Passed - Cr in normal range and within 360 days    Creatinine, Ser  Date Value Ref Range Status  05/21/2024 0.90 0.44 - 1.00 mg/dL Final         Passed - Valid encounter within last 6 months    Recent Outpatient Visits           2 months ago Hospital discharge follow-up   [facility] Primary Care & Sports Medicine at [facility], Vinay K, [place]   4 months ago Primary hypertension   [place] [facility] Medicine at [facility], Toribio SQUIBB, [place]   5 months ago Anxiety   [facility] Primary Care & Sports Medicine at [facility], Toribio SQUIBB, [place]               propranolol  (INDERAL ) 40 MG tablet [Pharmacy Med Name: PROPRANOLOL  40 MG TABLET] 180 tablet 0    Sig: TAKE 1 TABLET (40 MG TOTAL) BY MOUTH 2 (TWO) TIMES DAILY AS NEEDED (FOR TACHYCARDIA).     Cardiovascular:  Beta Blockers Passed - 08/17/2024  2:12 PM      Passed - Last BP in normal range    BP Readings from Last 1 Encounters:  06/12/24 111/83         Passed - Last Heart Rate in normal range    Pulse Readings from Last 1 Encounters:   06/12/24 90         Passed - Valid encounter within last 6 months    Recent Outpatient Visits           2 months ago Hospital discharge follow-up   [facility] Primary Care & Sports Medicine at [facility], Vinay K, [place]   4 months ago Primary hypertension   [facility] Primary Care & Sports Medicine at [facility], Toribio SQUIBB, [place]   5 months ago Anxiety   [facility] Primary Care & Sports Medicine at [facility], Toribio SQUIBB, [place]

## 2024-08-20 ENCOUNTER — Other Ambulatory Visit: Payer: Self-pay | Admitting: Physician Assistant

## 2024-08-20 DIAGNOSIS — I10 Essential (primary) hypertension: Secondary | ICD-10-CM

## 2024-08-21 NOTE — Telephone Encounter (Signed)
 Requested Prescriptions  Pending Prescriptions Disp Refills   lisinopril -hydrochlorothiazide  (ZESTORETIC ) 20-12.5 MG tablet [Pharmacy Med Name: LISINOPRIL -HCTZ 20-12.5 MG TAB] 90 tablet 0    Sig: TAKE 1 TABLET BY MOUTH EVERY DAY     Cardiovascular:  ACEI + Diuretic Combos Failed - 08/21/2024  1:50 PM      Failed - K in normal range and within 180 days    Potassium  Date Value Ref Range Status  05/21/2024 6.4 (HH) 3.5 - 5.1 mmol/L Final         Failed - eGFR is 30 or above and within 180 days    GFR, Estimated  Date Value Ref Range Status  03/16/2022 >60 >60 mL/min Final    Comment:    (NOTE) Calculated using the CKD-EPI Creatinine Equation (2021)    eGFR  Date Value Ref Range Status  02/20/2024 93 >59 mL/min/1.73 Final         Passed - Na in normal range and within 180 days    Sodium  Date Value Ref Range Status  05/21/2024 135 135 - 145 mmol/L Final  02/20/2024 140 134 - 144 mmol/L Final         Passed - Cr in normal range and within 180 days    Creatinine, Ser  Date Value Ref Range Status  05/21/2024 0.90 0.44 - 1.00 mg/dL Final         Passed - Patient is not pregnant      Passed - Last BP in normal range    BP Readings from Last 1 Encounters:  06/12/24 111/83         Passed - Valid encounter within last 6 months    Recent Outpatient Visits           3 months ago Hospital discharge follow-up   [facility] Primary Care & Sports Medicine at [facility], Vinay K, [place]   5 months ago Primary hypertension   [facility] Health Primary Care & Sports Medicine at [facility], Toribio SQUIBB, [place]   6 months ago Anxiety   [facility] Primary Care & Sports Medicine at [facility], Toribio SQUIBB, [place]

## 2024-11-22 ENCOUNTER — Other Ambulatory Visit: Payer: Self-pay | Admitting: Physician Assistant

## 2024-11-22 DIAGNOSIS — I10 Essential (primary) hypertension: Secondary | ICD-10-CM

## 2024-11-24 ENCOUNTER — Other Ambulatory Visit: Payer: Self-pay | Admitting: Physician Assistant

## 2024-11-24 DIAGNOSIS — R000 Tachycardia, unspecified: Secondary | ICD-10-CM

## 2024-11-26 ENCOUNTER — Other Ambulatory Visit: Payer: Self-pay | Admitting: Physician Assistant

## 2024-11-26 NOTE — Telephone Encounter (Signed)
 Please review.  KP

## 2025-01-12 ENCOUNTER — Other Ambulatory Visit: Payer: Self-pay | Admitting: Physician Assistant

## 2025-01-12 DIAGNOSIS — I10 Essential (primary) hypertension: Secondary | ICD-10-CM

## 2025-01-12 DIAGNOSIS — Z8673 Personal history of transient ischemic attack (TIA), and cerebral infarction without residual deficits: Secondary | ICD-10-CM

## 2025-01-12 DIAGNOSIS — N951 Menopausal and female climacteric states: Secondary | ICD-10-CM

## 2025-01-14 NOTE — Telephone Encounter (Signed)
 OFFICE VISIT NEEDED FOR ADDITIONAL REFILLS   Requested Prescriptions  Pending Prescriptions Disp Refills   cloNIDine  (CATAPRES ) 0.1 MG tablet [Pharmacy Med Name: CLONIDINE  HCL 0.1 MG TABLET] 60 tablet 0    Sig: TAKE 1 TABLET BY MOUTH TWICE A DAY     Cardiovascular:  Alpha-2 Agonists Failed - 01/14/2025 11:50 AM      Failed - Valid encounter within last 6 months    Recent Outpatient Visits           7 months ago Hospital discharge follow-up   [facility] Primary Care & Sports Medicine at [facility], Mackey POUR, [place]   9 months ago Primary hypertension   [place] [facility] Medicine at [facility], Toribio SQUIBB, [place]   10 months ago Anxiety   [facility] Primary Care & Sports Medicine at [facility], Toribio SQUIBB, [place]              Passed - Last BP in normal range    BP Readings from Last 1 Encounters:  06/12/24 111/83         Passed - Last Heart Rate in normal range    Pulse Readings from Last 1 Encounters:  06/12/24 90          clopidogrel  (PLAVIX ) 75 MG tablet [Pharmacy Med Name: CLOPIDOGREL  75 MG TABLET] 30 tablet 0    Sig: TAKE 1 TABLET BY MOUTH EVERY DAY     Hematology: Antiplatelets - clopidogrel  Failed - 01/14/2025 11:50 AM      Failed - HCT in normal range and within 180 days    HCT  Date Value Ref Range Status  05/21/2024 39.0 36.0 - 46.0 % Final   Hematocrit  Date Value Ref Range Status  02/20/2024 36.7 34.0 - 46.6 % Final         Failed - HGB in normal range and within 180 days    Hemoglobin  Date Value Ref Range Status  05/21/2024 13.3 12.0 - 15.0 g/dL Final  96/96/7974 87.7 11.1 - 15.9 g/dL Final         Failed - PLT in normal range and within 180 days    Platelets  Date Value Ref Range Status  05/21/2024 335 150 - 400 K/uL Final  02/20/2024 385 150 - 450 x10E3/uL Final         Failed - Valid encounter within last 6 months    Recent Outpatient Visits           7 months ago Hospital discharge follow-up    [facility] Primary Care & Sports Medicine at [facility], Vinay K, [place]   9 months ago Primary hypertension   [place] Primary Care & Sports Medicine at [facility], Toribio SQUIBB, [place]   10 months ago Anxiety   [facility] Health Primary Care & Sports Medicine at [facility], Toribio SQUIBB, [place]              Passed - Cr in normal range and within 360 days    Creatinine, Ser  Date Value Ref Range Status  05/21/2024 0.90 0.44 - 1.00 mg/dL Final

## 2025-01-24 ENCOUNTER — Other Ambulatory Visit: Payer: Self-pay | Admitting: Physician Assistant

## 2025-01-24 DIAGNOSIS — R110 Nausea: Secondary | ICD-10-CM

## 2025-01-25 NOTE — Telephone Encounter (Signed)
 Requested medication (s) are due for refill today: yes  Requested medication (s) are on the active medication list: yes  Last refill:  2024  Future visit scheduled: no  Notes to clinic:  Unable to refill per protocol, cannot delegate.      Requested Prescriptions  Pending Prescriptions Disp Refills   ondansetron  (ZOFRAN ) 8 MG tablet [Pharmacy Med Name: ONDANSETRON  HCL 8 MG TABLET] 18 tablet 3    Sig: TAKE 1 TABLET BY MOUTH EVERY 8 HOURS AS NEEDED FOR NAUSEA OR VOMITING.     Not Delegated - Gastroenterology: Antiemetics - ondansetron  Failed - 01/25/2025  4:23 PM      Failed - This refill cannot be delegated      Failed - AST in normal range and within 360 days    AST  Date Value Ref Range Status  02/20/2024 62 (H) 0 - 40 IU/L Final         Failed - ALT in normal range and within 360 days    ALT  Date Value Ref Range Status  02/20/2024 66 (H) 0 - 32 IU/L Final         Failed - Valid encounter within last 6 months    Recent Outpatient Visits           8 months ago Hospital discharge follow-up   [facility] at [facility], Vinay K, [place]   10 months ago Primary hypertension   [facility] at [facility], Toribio SQUIBB, [place]   11 months ago Anxiety   [facility] at [facility], Daniel P, [place]               promethazine  (PHENERGAN ) 25 MG tablet [Pharmacy Med Name: PROMETHAZINE  25 MG TABLET] 90 tablet 2    Sig: TAKE 1 TABLET BY MOUTH EVERY 6 HOURS AS NEEDED FOR NAUSEA OR VOMITING.     Not Delegated - Gastroenterology: Antiemetics Failed - 01/25/2025  4:23 PM      Failed - This refill cannot be delegated      Failed - Valid encounter within last 6 months    Recent Outpatient Visits           8 months ago Hospital discharge follow-up   [facility] at [facility], Vinay K, [place]   10 months ago Primary  hypertension   [facility] at [facility], Toribio SQUIBB, [place]   11 months ago Anxiety   [facility] Medicine at [facility], Toribio SQUIBB, [place]
